# Patient Record
Sex: Female | Born: 1990
Health system: Southern US, Community
[De-identification: ages and names within clinical notes are randomized; demographics above are authoritative.]

## PROBLEM LIST (undated history)

## (undated) ENCOUNTER — Inpatient Hospital Stay (HOSPITAL_COMMUNITY): Payer: Self-pay

## (undated) ENCOUNTER — Ambulatory Visit (HOSPITAL_COMMUNITY): Admission: EM | Payer: Medicaid Other

## (undated) DIAGNOSIS — Z789 Other specified health status: Secondary | ICD-10-CM

## (undated) DIAGNOSIS — O210 Mild hyperemesis gravidarum: Secondary | ICD-10-CM

## (undated) HISTORY — PX: TONSILLECTOMY: SUR1361

---

## 2015-08-12 ENCOUNTER — Emergency Department (HOSPITAL_COMMUNITY)
Admission: EM | Admit: 2015-08-12 | Discharge: 2015-08-12 | Disposition: A | Discharge status: Home / Self Care | Payer: Medicaid Other

## 2015-08-12 NOTE — ED Notes (Signed)
Called pt to be triaged again, no response.

## 2015-08-12 NOTE — ED Notes (Signed)
Called pt to be triaged, no response.  

## 2015-11-18 ENCOUNTER — Encounter (HOSPITAL_COMMUNITY): Payer: Self-pay

## 2015-11-18 ENCOUNTER — Emergency Department (HOSPITAL_COMMUNITY)
Admission: EM | Admit: 2015-11-18 | Discharge: 2015-11-18 | Disposition: A | Discharge status: Home / Self Care | Payer: Medicaid Other | Attending: Emergency Medicine | Admitting: Emergency Medicine

## 2015-11-18 DIAGNOSIS — R112 Nausea with vomiting, unspecified: Secondary | ICD-10-CM | POA: Diagnosis not present

## 2015-11-18 DIAGNOSIS — R5383 Other fatigue: Secondary | ICD-10-CM | POA: Diagnosis not present

## 2015-11-18 DIAGNOSIS — F17200 Nicotine dependence, unspecified, uncomplicated: Secondary | ICD-10-CM | POA: Diagnosis not present

## 2015-11-18 DIAGNOSIS — Z5321 Procedure and treatment not carried out due to patient leaving prior to being seen by health care provider: Secondary | ICD-10-CM | POA: Diagnosis not present

## 2015-11-18 LAB — COMPREHENSIVE METABOLIC PANEL
ALT: 19 U/L (ref 14–54)
AST: 22 U/L (ref 15–41)
Albumin: 4.2 g/dL (ref 3.5–5.0)
Alkaline Phosphatase: 42 U/L (ref 38–126)
Anion gap: 4 — ABNORMAL LOW (ref 5–15)
BILIRUBIN TOTAL: 0.4 mg/dL (ref 0.3–1.2)
BUN: 11 mg/dL (ref 6–20)
CHLORIDE: 110 mmol/L (ref 101–111)
CO2: 26 mmol/L (ref 22–32)
CREATININE: 0.87 mg/dL (ref 0.44–1.00)
Calcium: 9.2 mg/dL (ref 8.9–10.3)
GFR calc Af Amer: >60 mL/min (ref >60)
Glucose, Bld: 84 mg/dL (ref 65–99)
Potassium: 4.2 mmol/L (ref 3.5–5.1)
Sodium: 140 mmol/L (ref 135–145)
Total Protein: 6.8 g/dL (ref 6.5–8.1)

## 2015-11-18 LAB — CBC
HCT: 40.2 % (ref 36.0–46.0)
Hemoglobin: 12.5 g/dL (ref 12.0–15.0)
MCH: 28.7 pg (ref 26.0–34.0)
MCHC: 31.1 g/dL (ref 30.0–36.0)
MCV: 92.2 fL (ref 78.0–100.0)
PLATELETS: 265 10*3/uL (ref 150–400)
RBC: 4.36 MIL/uL (ref 3.87–5.11)
RDW: 12.8 % (ref 11.5–15.5)
WBC: 8.8 10*3/uL (ref 4.0–10.5)

## 2015-11-18 LAB — LIPASE, BLOOD: LIPASE: 36 U/L (ref 11–51)

## 2015-11-18 NOTE — ED Triage Notes (Signed)
Pt is here she reports generalized body aches, fatigue, and n/v since Friday. Pt reports she feels unlike herself and has been waking in the middle of the night feeling sick.

## 2015-11-18 NOTE — ED Notes (Signed)
CALLED SEVERAL TIMES AND NO ANSWERS

## 2016-02-15 ENCOUNTER — Emergency Department (HOSPITAL_COMMUNITY)
Admission: EM | Admit: 2016-02-15 | Discharge: 2016-02-15 | Disposition: A | Discharge status: Home / Self Care | Payer: Medicaid Other | Attending: Emergency Medicine | Admitting: Emergency Medicine

## 2016-02-15 ENCOUNTER — Encounter (HOSPITAL_COMMUNITY): Payer: Self-pay

## 2016-02-15 DIAGNOSIS — F17200 Nicotine dependence, unspecified, uncomplicated: Secondary | ICD-10-CM | POA: Insufficient documentation

## 2016-02-15 DIAGNOSIS — J069 Acute upper respiratory infection, unspecified: Secondary | ICD-10-CM | POA: Insufficient documentation

## 2016-02-15 LAB — URINALYSIS, ROUTINE W REFLEX MICROSCOPIC
BILIRUBIN URINE: NEGATIVE
Glucose, UA: NEGATIVE mg/dL
HGB URINE DIPSTICK: NEGATIVE
Ketones, ur: NEGATIVE mg/dL
Leukocytes, UA: NEGATIVE
Nitrite: NEGATIVE
PH: 7 (ref 5.0–8.0)
Protein, ur: NEGATIVE mg/dL
SPECIFIC GRAVITY, URINE: 1.023 (ref 1.005–1.030)

## 2016-02-15 LAB — RAPID STREP SCREEN (MED CTR MEBANE ONLY): Streptococcus, Group A Screen (Direct): NEGATIVE

## 2016-02-15 LAB — PREGNANCY, URINE: Preg Test, Ur: NEGATIVE

## 2016-02-15 MED ORDER — AZITHROMYCIN 250 MG PO TABS: ORAL_TABLET | ORAL | 0 refills | Status: DC

## 2016-02-15 MED ORDER — CARBAMIDE PEROXIDE 6.5 % OT SOLN
5.0000 [drp] | Freq: Once | OTIC | Status: AC
  Administered 2016-02-15: 5 [drp] via OTIC
  Filled 2016-02-15: qty 15

## 2016-02-15 NOTE — Care Management Note (Signed)
Case Management Note  Patient Details  Name: Brittany Bernard MRN: [id number] Date of Birth: May 17, 1990  Subjective/Objective:                  25 y.o. female who presents with cc of URI sxs. Of note, the patient has coarse facial features including heavy brow, prominent frontal and jaw bones. She acknowledges that her feet have gotten larger and she has had a recent growth spurt and is now taller than her mother over the last year. She has been having headaches and worsening vision changes over time. 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   Action/Plan: Follow for disposition needs. Follow-up appointment   Expected Discharge Date:  02/15/16               Expected Discharge Plan:  Home/Self Care  In-House Referral:  NA  Discharge planning Services  CM Consult   Status of Service:  Completed, signed off  If discussed at Long Length of Stay Meetings, dates discussed:    Additional Comments: Ariyona Eid J. Lucretia RoersWood, RN, BSN, [place] [phone] [facility] appointment with Dr Hyman HopesJegede on 12/20 @2 :00.  Spoke with pt at bedside and advised to please arrive 15 min early and take a picture ID and your current medications.  Pt verbalizes understanding of keeping appointment.  Oletta CohnWood, Annasofia Pohl, RN 02/15/2016, 11:39 AM

## 2016-02-15 NOTE — ED Triage Notes (Signed)
Pt here reporting a week or URI symptoms, chills, sore throat, and ear pain. She reports fever at home. Tylenol for relief. Afebrile currently.

## 2016-02-15 NOTE — ED Provider Notes (Signed)
[facility]EM[facility]ENCY DEPT Provider Note   CSN: [id number] Arrival date & time: 02/15/16  [id number]     History   Chief Complaint Chief Complaint  Patient presents with  . Sore Throat  . URI    HPI Brittany Bernard is a 25 y.o. female who presents with cc of URI sxs. Onset 1.5 weeks. She has had cough, fever with T.MAX  101 F. She has a cough productive of clear sputum, sinus pressure, BL ear pain, Headache, myalgias and sore throat. Of note, the patient has coarse facial features including heavy brow, prominent frontal and jaw bones. She acknowledges that her feet have gotten larger and she has had a recent growth spurt and is now taller than her mother over the last year. She has been having headaches and worsening vision changes over time  HPI  History reviewed. No pertinent past medical history.  There are no active problems to display for this patient.   History reviewed. No pertinent surgical history.  OB History    No data available       Home Medications    Prior to Admission medications   Not on File    Family History History reviewed. No pertinent family history.  Social History Social History  Substance Use Topics  . Smoking status: Current Every Day Smoker    Packs/day: 0.50  . Smokeless tobacco: Never Used  . Alcohol use Yes     Comment: social      Allergies   Patient has no known allergies.   Review of Systems Review of Systems Ten systems reviewed and are negative for acute change, except as noted in the HPI.    Physical Exam Updated Vital Signs BP 104/66 (BP Location: Right Arm)   Temp 97.9 F (36.6 C) (Oral)   Resp 16   Ht 5\' 10"  (1.778 m)   Wt 90.7 kg   BMI 28.70 kg/m   Physical Exam  Constitutional: She is oriented to person, place, and time. She appears well-developed and well-nourished. No distress.  HENT:  Head: Normocephalic and atraumatic.  R TM normal,  left TM with cerumen impaction  Oropharynx with mild erythema, no  exudates  Coarse facial features with prominent jaw, brow and facial bones  Eyes: Conjunctivae and EOM are normal. Pupils are equal, round, and reactive to light. No scleral icterus.  Neck: Normal range of motion.  Cardiovascular: Normal rate, regular rhythm and normal heart sounds.  Exam reveals no gallop and no friction rub.   No murmur heard. Pulmonary/Chest: Effort normal and breath sounds normal. No respiratory distress. She has no wheezes.  Abdominal: Soft. Bowel sounds are normal. She exhibits no distension and no mass. There is no tenderness. There is no guarding.  Lymphadenopathy:    She has cervical adenopathy (tender tonsilar adenopathy).  Neurological: She is alert and oriented to person, place, and time.  Skin: Skin is warm and dry. Capillary refill takes less than 2 seconds. She is not diaphoretic.     ED Treatments / Results  Labs (all labs ordered are listed, but only abnormal results are displayed) Labs Reviewed  PREGNANCY, URINE    EKG  EKG Interpretation None       Radiology No results found.  Procedures Procedures (including critical care time)  Medications Ordered in ED Medications - No data to display   Initial Impression / Assessment and Plan / ED Course  I have reviewed the triage vital signs and the nursing notes.  Pertinent labs & imaging  results that were available during my care of the patient were reviewed by me and considered in my medical decision making (see chart for details).  Clinical Course     Patient with greater than 10 days of persistent sinus pain, fevers and URI symptoms. She'll be treated with azithromycin. Patient symptoms and features, also concerning for acromegaly. I have discussed the case with my attending physician, Dr. Rosalia Hammersay. Care management involved and [facility]. I have something in box message to Dr. Hyman HopesJegede. He will follow up with the patient on Thursday.  Final  Clinical Impressions(s) / ED Diagnoses   Final diagnoses:  Upper respiratory tract infection, unspecified type    New Prescriptions New Prescriptions   No medications on file     Arthor Captainbigail Raylyn Speckman, PA-C 02/15/16 1816    Margarita Grizzleanielle Ray, MD 02/22/16 1501

## 2016-02-15 NOTE — Discharge Instructions (Addendum)
[facility] [place] [phone]  You appear to have an upper respiratory infection (URI). An upper respiratory tract infection, or cold, is a viral infection of the air passages leading to the lungs. It is contagious and can be spread to others, especially during the first 3 or 4 days. It cannot be cured by antibiotics or other medicines. RETURN IMMEDIATELY IF you develop shortness of breath, confusion or altered mental status, a new rash, become dizzy, faint, or poorly responsive, or are unable to be cared for at home.

## 2016-02-17 LAB — CULTURE, GROUP A STREP (THRC)

## 2016-02-18 ENCOUNTER — Encounter: Payer: Self-pay | Admitting: Internal Medicine

## 2016-02-18 ENCOUNTER — Ambulatory Visit: Payer: Medicaid Other | Attending: Internal Medicine | Admitting: Internal Medicine

## 2016-02-18 VITALS — BP 98/53 | HR 102 | Temp 98.4°F | Resp 18 | Ht 70.0 in | Wt 195.4 lb

## 2016-02-18 DIAGNOSIS — Z5189 Encounter for other specified aftercare: Secondary | ICD-10-CM | POA: Insufficient documentation

## 2016-02-18 DIAGNOSIS — M898X9 Other specified disorders of bone, unspecified site: Secondary | ICD-10-CM

## 2016-02-18 DIAGNOSIS — E220 Acromegaly and pituitary gigantism: Secondary | ICD-10-CM

## 2016-02-18 DIAGNOSIS — Z87891 Personal history of nicotine dependence: Secondary | ICD-10-CM | POA: Diagnosis not present

## 2016-02-18 DIAGNOSIS — J029 Acute pharyngitis, unspecified: Secondary | ICD-10-CM

## 2016-02-18 HISTORY — DX: Other specified disorders of bone, unspecified site: M89.8X9

## 2016-02-18 LAB — COMPLETE METABOLIC PANEL WITH GFR
ALT: 12 U/L (ref 6–29)
AST: 17 U/L (ref 10–30)
Albumin: 4.1 g/dL (ref 3.6–5.1)
Alkaline Phosphatase: 45 U/L (ref 33–115)
BILIRUBIN TOTAL: 0.3 mg/dL (ref 0.2–1.2)
BUN: 11 mg/dL (ref 7–25)
CHLORIDE: 105 mmol/L (ref 98–110)
CO2: 24 mmol/L (ref 20–31)
Calcium: 9.3 mg/dL (ref 8.6–10.2)
Creat: 0.74 mg/dL (ref 0.50–1.10)
Glucose, Bld: 80 mg/dL (ref 65–99)
Potassium: 4.1 mmol/L (ref 3.5–5.3)
Sodium: 139 mmol/L (ref 135–146)
Total Protein: 7.2 g/dL (ref 6.1–8.1)

## 2016-02-18 LAB — POCT GLYCOSYLATED HEMOGLOBIN (HGB A1C): HEMOGLOBIN A1C: 5.4

## 2016-02-18 LAB — TSH: TSH: 0.41 m[IU]/L

## 2016-02-18 NOTE — Patient Instructions (Signed)
Acromegaly Acromegaly is a disease in which some parts of the body grow out of proportion to the rest of the body. It commonly affects the nose, ears, jaw, fingers, and toes. Acromegaly can lead to a number of conditions over time, including:  Diabetes.  Arthritis.  Heart disease.  Sleep apnea.  High blood pressure (hypertension). What are the causes? The most common cause of acromegaly is an overproduction of growth hormone by the pituitary gland. This can result from:  A noncancerous (benign) tumor on the pituitary gland. This is a common cause.  A tumor in another part of the body. This is a rare cause. Acromegaly can also be caused by a tumor that makes growth hormone itself. What are the signs or symptoms? Common signs and symptoms include:  Larger than normal hands, feet, head, face, brow, or jaw.  Swelling of the hands and feet.  Headaches.  Blurred vision.  Joint pain.  Numbness and tingling of the fingers.  Menstrual cycle changes in women.  Impotence in men.  Widely-spaced teeth. How is this diagnosed? To make a diagnosis, your health care provider will perform a physical exam and order tests. These will include blood tests and imaging tests, such as an MRI or a CT scan. How is this treated? Acromegaly may be treated with:  Surgery to remove the tumor that is causing the condition.  Medicines. Medicines may be given instead of surgery, or they may be given before surgery to shrink the tumor. They may also be given after surgery:  If the surgery is not successful.  If hormone levels do not return to normal.  Radiation therapy. This is done if surgery and medicines are not effective. Follow these instructions at home:  Keep all follow-up visits as directed by your health care provider. It is important to keep follow-up appointments all through your life so that your health care provider can:  Check whether your pituitary gland is working  normally.  Look for complications of the disease.  Get eye exams regularly.  Take medicines only as directed by your health care provider. Contact a health care provider if:  Your vision changes.  Your headaches get worse.  You have new symptoms or new pain or numbness.  You have pain or pressure in your chest.  Your symptoms do not get better with treatment. This information is not intended to replace advice given to you by your health care provider. Make sure you discuss any questions you have with your health care provider. Document Released: 10/21/2003 Document Revised: 06/24/2015 Document Reviewed: 09/23/2013 Elsevier Interactive Patient Education  2017 [employer]

## 2016-02-18 NOTE — Progress Notes (Signed)
Patient is here for a sore throat, she has pain scale of 8  Patient decline the flu shot today.

## 2016-02-18 NOTE — Progress Notes (Signed)
Brittany Bernard, is a 25 y.o. female  WUJ:[id number]:[id number]  NFA:[id number]  DOB - 1990/06/07  CC:  Chief Complaint  Patient presents with  . Sore Throat      HPI: Brittany Bernard is a 25 y.o. female here today to establish medical care. Patient was referred from the ED during a recent visit for URI. She was noticed to have coarse facial features and some physical features of gigantism. Appointment was made for full workup. She has no new complaint today but has noticed bone pains for years and muscle pains. She acknowledges that her feet have gotten larger and she has had a recent growth spurt and is now taller than her mother over the last year. She also has occasional headaches especially in the mornings and sometimes she has double/blurry vision. She has no significant family history of gigantism or acromegaly. No family history of any endocrine or autoimmune disorder, no history of brain tumor. Patient has No chest pain, No abdominal pain - No Nausea, No new weakness tingling or numbness, No Cough - SOB. She is currently on antibiotics for URI.   No Known Allergies History reviewed. No pertinent past medical history. Current Outpatient Prescriptions on File Prior to Visit  Medication Sig Dispense Refill  . azithromycin (ZITHROMAX Z-PAK) 250 MG tablet 2 po day one, then 1 daily x 4 days 5 tablet 0   No current facility-administered medications on file prior to visit.    History reviewed. No pertinent family history. Social History   Social History  . Marital status: Single    Spouse name: N/A  . Number of children: N/A  . Years of education: N/A   Occupational History  . Not on file.   Social History Main Topics  . Smoking status: Former Smoker    Packs/day: 0.50    Quit date: 02/14/2016  . Smokeless tobacco: Never Used  . Alcohol use Yes     Comment: social   . Drug use:     Types: Marijuana  . Sexual activity: Not on file   Other Topics Concern  . Not on file   Social  History Narrative  . No narrative on file    Review of Systems: Constitutional: Negative for fever, chills, diaphoresis, activity change, appetite change and fatigue. HENT: Negative for ear pain, nosebleeds, congestion, facial swelling, rhinorrhea, neck pain, neck stiffness and ear discharge.  Eyes: Negative for pain, discharge, redness, itching and visual disturbance. Respiratory: Negative for cough, choking, chest tightness, shortness of breath, wheezing and stridor.  Cardiovascular: Negative for chest pain, palpitations and leg swelling. Gastrointestinal: Negative for abdominal distention. Genitourinary: Negative for dysuria, urgency, frequency, hematuria, flank pain, decreased urine volume, difficulty urinating and dyspareunia.  Neurological: Negative for dizziness, tremors, seizures, syncope, facial asymmetry, speech difficulty, weakness, light-headedness, numbness and headaches.  Hematological: Negative for adenopathy. Does not bruise/bleed easily. Psychiatric/Behavioral: Negative for hallucinations, behavioral problems, confusion, dysphoric mood, decreased concentration and agitation.    Objective:   Vitals:   02/18/16 1427  BP: (!) 98/53  Pulse: (!) 102  Resp: 18  Temp: 98.4 F (36.9 C)    Physical Exam: Constitutional: Patient appears well-developed and well-nourished. No distress. Prominent jaw and frontal bones. HENT: Normocephalic, atraumatic, External right and left ear normal. Oropharynx is clear and moist.  Eyes: Conjunctivae and EOM are normal. PERRLA, no scleral icterus. Neck: Normal ROM. Neck supple. No JVD. No tracheal deviation. No thyromegaly. CVS: RRR, S1/S2 +, no murmurs, no gallops, no carotid bruit.  Pulmonary: Effort  and breath sounds normal, no stridor, rhonchi, wheezes, rales.  Abdominal: Soft. BS +, no distension, tenderness, rebound or guarding.  Musculoskeletal: Normal range of motion. No edema and no tenderness.  Lymphadenopathy: No lymphadenopathy  noted, cervical, inguinal or axillary Neuro: Alert. Normal reflexes, muscle tone coordination. No cranial nerve deficit. Skin: Skin is warm and dry. No rash noted. Not diaphoretic. No erythema. No pallor. Psychiatric: Normal mood and affect. Behavior, judgment, thought content normal.  Lab Results  Component Value Date   WBC 8.8 11/18/2015   HGB 12.5 11/18/2015   HCT 40.2 11/18/2015   MCV 92.2 11/18/2015   PLT 265 11/18/2015   Lab Results  Component Value Date   CREATININE 0.87 11/18/2015   BUN 11 11/18/2015   NA 140 11/18/2015   K 4.2 11/18/2015   CL 110 11/18/2015   CO2 26 11/18/2015    No results found for: HGBA1C Lipid Panel  No results found for: CHOL, TRIG, HDL, CHOLHDL, VLDL, LDLCALC      Assessment and plan:   1. Bone pain  - VITAMIN D 25 Hydroxy (Vit-D Deficiency, Fractures)  2. Sorethroat  - Start the prescribed medication - Follow up PRN is symptom persists  3. Gigantism (HCC)  - Insulin-like growth factor - POCT glycosylated hemoglobin (Hb A1C) - TSH - COMPLETE METABOLIC PANEL WITH GFR - Prolactin   Return in about 6 months (around 08/18/2016), or if symptoms worsen or fail to improve, for Routine Follow Up.  The patient was given clear instructions to go to ER or return to medical center if symptoms don't improve, worsen or new problems develop. The patient verbalized understanding. The patient was told to call to get lab results if they haven't heard anything in the next week.     This note has been created with Education officer, environmentalDragon speech recognition software and smart phrase technology. Any transcriptional errors are unintentional.    Jeanann LewandowskyJEGEDE, Jasmaine Rochel, MD, MHA, Maxwell CaulFACP, FAAP, CPE [facility] [place], [place] [phone]   02/18/2016, 3:00 PM

## 2016-02-19 LAB — VITAMIN D 25 HYDROXY (VIT D DEFICIENCY, FRACTURES): VIT D 25 HYDROXY: 13 ng/mL — AB (ref 30–100)

## 2016-02-19 LAB — PROLACTIN: Prolactin: 6.6 ng/mL

## 2016-02-24 LAB — INSULIN-LIKE GROWTH FACTOR
IGF-I, LC/MS: 223 ng/mL (ref 63–373)
Z-Score (Female): 0.5 SD (ref ?–2.0)

## 2016-02-26 ENCOUNTER — Other Ambulatory Visit: Payer: Self-pay | Admitting: Internal Medicine

## 2016-02-26 MED ORDER — VITAMIN D (ERGOCALCIFEROL) 1.25 MG (50000 UNIT) PO CAPS: 50000.0000 [IU] | ORAL_CAPSULE | ORAL | 1 refills | Status: DC

## 2016-03-01 ENCOUNTER — Telehealth: Payer: Self-pay

## 2016-03-01 NOTE — Telephone Encounter (Signed)
Patient Verify DOB  Patient was aware of her results being normal and other than her Vitamin D was low and was prescribed to pharmacy to pick up.  Also was inform that if she still haves blurry/double vision we was going to do an MRI of the brain. Other than that the patient did not have further questions.

## 2016-05-16 ENCOUNTER — Emergency Department (HOSPITAL_COMMUNITY)
Admission: EM | Admit: 2016-05-16 | Discharge: 2016-05-17 | Disposition: A | Discharge status: Home / Self Care | Payer: Medicaid Other | Attending: Emergency Medicine | Admitting: Emergency Medicine

## 2016-05-16 ENCOUNTER — Encounter (HOSPITAL_COMMUNITY): Payer: Self-pay | Admitting: Emergency Medicine

## 2016-05-16 DIAGNOSIS — Z5321 Procedure and treatment not carried out due to patient leaving prior to being seen by health care provider: Secondary | ICD-10-CM | POA: Diagnosis not present

## 2016-05-16 DIAGNOSIS — R1110 Vomiting, unspecified: Secondary | ICD-10-CM | POA: Diagnosis not present

## 2016-05-16 DIAGNOSIS — Z87891 Personal history of nicotine dependence: Secondary | ICD-10-CM | POA: Insufficient documentation

## 2016-05-16 LAB — CBC WITH DIFFERENTIAL/PLATELET
BASOS ABS: 0 10*3/uL (ref 0.0–0.1)
Basophils Relative: 0 %
Eosinophils Absolute: 0 10*3/uL (ref 0.0–0.7)
Eosinophils Relative: 0 %
HEMATOCRIT: 40.8 % (ref 36.0–46.0)
Hemoglobin: 13.7 g/dL (ref 12.0–15.0)
LYMPHS PCT: 6 %
Lymphs Abs: 0.7 10*3/uL (ref 0.7–4.0)
MCH: 29.7 pg (ref 26.0–34.0)
MCHC: 33.6 g/dL (ref 30.0–36.0)
MCV: 88.3 fL (ref 78.0–100.0)
Monocytes Absolute: 0.2 10*3/uL (ref 0.1–1.0)
Monocytes Relative: 2 %
NEUTROS ABS: 10.5 10*3/uL — AB (ref 1.7–7.7)
NEUTROS PCT: 92 %
PLATELETS: 266 10*3/uL (ref 150–400)
RBC: 4.62 MIL/uL (ref 3.87–5.11)
RDW: 13.2 % (ref 11.5–15.5)
WBC: 11.4 10*3/uL — AB (ref 4.0–10.5)

## 2016-05-16 LAB — BASIC METABOLIC PANEL
ANION GAP: 14 (ref 5–15)
BUN: 13 mg/dL (ref 6–20)
CO2: 20 mmol/L — AB (ref 22–32)
Calcium: 9.8 mg/dL (ref 8.9–10.3)
Chloride: 105 mmol/L (ref 101–111)
Creatinine, Ser: 0.83 mg/dL (ref 0.44–1.00)
GFR calc Af Amer: >60 mL/min (ref >60)
Glucose, Bld: 134 mg/dL — ABNORMAL HIGH (ref 65–99)
POTASSIUM: 3.4 mmol/L — AB (ref 3.5–5.1)
Sodium: 139 mmol/L (ref 135–145)

## 2016-05-16 LAB — I-STAT BETA HCG BLOOD, ED (MC, WL, AP ONLY): I-stat hCG, quantitative: <5 m[IU]/mL (ref <5)

## 2016-05-16 MED ORDER — ONDANSETRON 4 MG PO TBDP
ORAL_TABLET | ORAL | Status: AC
  Filled 2016-05-16: qty 1

## 2016-05-16 MED ORDER — ONDANSETRON 4 MG PO TBDP
4.0000 mg | ORAL_TABLET | Freq: Once | ORAL | Status: AC
  Administered 2016-05-16: 4 mg via ORAL

## 2016-05-16 NOTE — ED Triage Notes (Signed)
Patient reports emesis with diarrhea , chills and generalized body aches onset today .

## 2016-05-18 ENCOUNTER — Emergency Department (HOSPITAL_COMMUNITY)
Admission: EM | Admit: 2016-05-18 | Discharge: 2016-05-18 | Disposition: A | Discharge status: Home / Self Care | Payer: Medicaid Other | Attending: Emergency Medicine | Admitting: Emergency Medicine

## 2016-05-18 ENCOUNTER — Emergency Department (HOSPITAL_COMMUNITY): Payer: Medicaid Other

## 2016-05-18 ENCOUNTER — Encounter (HOSPITAL_COMMUNITY): Payer: Self-pay

## 2016-05-18 DIAGNOSIS — Z87891 Personal history of nicotine dependence: Secondary | ICD-10-CM | POA: Diagnosis not present

## 2016-05-18 DIAGNOSIS — Z79899 Other long term (current) drug therapy: Secondary | ICD-10-CM | POA: Insufficient documentation

## 2016-05-18 DIAGNOSIS — R112 Nausea with vomiting, unspecified: Secondary | ICD-10-CM | POA: Insufficient documentation

## 2016-05-18 DIAGNOSIS — R197 Diarrhea, unspecified: Secondary | ICD-10-CM | POA: Diagnosis present

## 2016-05-18 DIAGNOSIS — E876 Hypokalemia: Secondary | ICD-10-CM | POA: Insufficient documentation

## 2016-05-18 LAB — COMPREHENSIVE METABOLIC PANEL
ALT: 22 U/L (ref 14–54)
AST: 25 U/L (ref 15–41)
Albumin: 5 g/dL (ref 3.5–5.0)
Alkaline Phosphatase: 45 U/L (ref 38–126)
Anion gap: 11 (ref 5–15)
BILIRUBIN TOTAL: 0.9 mg/dL (ref 0.3–1.2)
BUN: 19 mg/dL (ref 6–20)
CALCIUM: 9.7 mg/dL (ref 8.9–10.3)
CO2: 22 mmol/L (ref 22–32)
Chloride: 103 mmol/L (ref 101–111)
Creatinine, Ser: 0.9 mg/dL (ref 0.44–1.00)
GFR calc Af Amer: >60 mL/min (ref >60)
GFR calc non Af Amer: >60 mL/min (ref >60)
Glucose, Bld: 119 mg/dL — ABNORMAL HIGH (ref 65–99)
Potassium: 2.9 mmol/L — ABNORMAL LOW (ref 3.5–5.1)
Sodium: 136 mmol/L (ref 135–145)
TOTAL PROTEIN: 8.6 g/dL — AB (ref 6.5–8.1)

## 2016-05-18 LAB — URINALYSIS, ROUTINE W REFLEX MICROSCOPIC
BILIRUBIN URINE: NEGATIVE
Glucose, UA: NEGATIVE mg/dL
Ketones, ur: 80 mg/dL — AB
Leukocytes, UA: NEGATIVE
NITRITE: NEGATIVE
Protein, ur: 30 mg/dL — AB
SPECIFIC GRAVITY, URINE: 1.034 — AB (ref 1.005–1.030)
pH: 5 (ref 5.0–8.0)

## 2016-05-18 LAB — CBC
HCT: 42.5 % (ref 36.0–46.0)
HEMOGLOBIN: 14.5 g/dL (ref 12.0–15.0)
MCH: 29.5 pg (ref 26.0–34.0)
MCHC: 34.1 g/dL (ref 30.0–36.0)
MCV: 86.6 fL (ref 78.0–100.0)
Platelets: 321 10*3/uL (ref 150–400)
RBC: 4.91 MIL/uL (ref 3.87–5.11)
RDW: 13.3 % (ref 11.5–15.5)
WBC: 15.1 10*3/uL — ABNORMAL HIGH (ref 4.0–10.5)

## 2016-05-18 LAB — LIPASE, BLOOD: Lipase: 18 U/L (ref 11–51)

## 2016-05-18 LAB — I-STAT BETA HCG BLOOD, ED (MC, WL, AP ONLY): I-stat hCG, quantitative: <5 m[IU]/mL (ref <5)

## 2016-05-18 MED ORDER — ONDANSETRON HCL 4 MG/2ML IJ SOLN
4.0000 mg | Freq: Once | INTRAMUSCULAR | Status: AC
  Administered 2016-05-18: 4 mg via INTRAVENOUS
  Filled 2016-05-18: qty 2

## 2016-05-18 MED ORDER — CAPSAICIN 0.025 % EX CREA
TOPICAL_CREAM | Freq: Once | CUTANEOUS | Status: AC
  Administered 2016-05-18: 13:00:00 via TOPICAL
  Filled 2016-05-18: qty 60

## 2016-05-18 MED ORDER — POTASSIUM CHLORIDE ER 20 MEQ PO TBCR: 20.0000 meq | EXTENDED_RELEASE_TABLET | Freq: Two times a day (BID) | ORAL | 0 refills | Status: DC

## 2016-05-18 MED ORDER — HALOPERIDOL LACTATE 5 MG/ML IJ SOLN
2.0000 mg | Freq: Once | INTRAMUSCULAR | Status: AC
  Administered 2016-05-18: 2 mg via INTRAVENOUS
  Filled 2016-05-18: qty 1

## 2016-05-18 MED ORDER — PROMETHAZINE HCL 25 MG PO TABS: 25.0000 mg | ORAL_TABLET | Freq: Four times a day (QID) | ORAL | 0 refills | Status: DC | PRN

## 2016-05-18 MED ORDER — SODIUM CHLORIDE 0.9 % IV BOLUS (SEPSIS)
1000.0000 mL | Freq: Once | INTRAVENOUS | Status: AC
  Administered 2016-05-18: 1000 mL via INTRAVENOUS

## 2016-05-18 MED ORDER — ONDANSETRON 8 MG PO TBDP: 8.0000 mg | ORAL_TABLET | Freq: Three times a day (TID) | ORAL | 0 refills | Status: DC | PRN

## 2016-05-18 NOTE — ED Triage Notes (Addendum)
Pt c/o generalized abdominal pain and n/v/d x 5 days.  Pain score 8/10.  Pt reports taking OTC medication w/o relief.  Pt was recently on a cruise.  Pt noticed some vaginal spotting this morning.  Sts "I havent seen that in years."  Pt has an IUD.  Pt [facility] x 2 days ago.

## 2016-05-18 NOTE — ED Notes (Signed)
This RN called into pts room after application of zostrix cream. Pt reports burning sensation and requesting pain medication through IV.

## 2016-05-18 NOTE — Discharge Instructions (Signed)
Zofran for nausea and vomiting. Phenergan if no relief with zofran. Avoid marijuana use. Follow up with family doctor if not improving. Follow up for potassium recheck. Take potassium as prescribed, it was low today. Return if unable to keep liquids down, if develop fever, worsening pain, any new concerning findings.

## 2016-05-18 NOTE — ED Provider Notes (Signed)
[facility]-EMERGENCY DEPT Provider Note   CSN: [id number] Arrival date & time: 05/18/16  [id number]     History   Chief Complaint Chief Complaint  Patient presents with  . Abdominal Pain  . Emesis  . Diarrhea    HPI Brittany Bernard is a 26 y.o. female.  HPI Brittany Bernard is a 26 y.o. female presents to emergency department complaining of nausea, vomiting, abdominal pain for 3 days. She states she is unable to keep anything down. Reports some diarrhea which now resolved. She states she feels dizzy and dehydrated. She has not tried any medications to help her with her symptoms. She denies being pregnant. Denies any fever or chills. No URI symptoms. She did eat at cookout before symptoms began. No other sick contacts.   History reviewed. No pertinent past medical history.  Patient Active Problem List   Diagnosis Date Noted  . Bone pain 02/18/2016  . 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 TONSILLECTOMY      OB History    No data available       Home Medications    Prior to Admission medications   Medication Sig Start Date End Date Taking? Authorizing Provider  OVER THE COUNTER MEDICATION Take 1 tablet by mouth daily as needed (nausea). OTC nausea medication-- unsure of name   Yes Historical Provider, MD  Vitamin D, Ergocalciferol, (DRISDOL) 50000 units CAPS capsule Take 1 capsule (50,000 Units total) by mouth every 7 (seven) days. Patient not taking: Reported on 05/18/2016 02/26/16   Quentin Angstlugbemiga E Jegede, MD    Family History History reviewed. No pertinent family history.  Social History Social History  Substance Use Topics  . Smoking status: Former Smoker    Packs/day: 0.50    Quit date: 02/14/2016  . Smokeless tobacco: Never Used  . Alcohol use Yes     Comment: social      Allergies   Patient has no known allergies.   Review of Systems Review of Systems  Constitutional: Negative for chills  and fever.  Respiratory: Negative for cough, chest tightness and shortness of breath.   Cardiovascular: Negative for chest pain, palpitations and leg swelling.  Gastrointestinal: Positive for abdominal pain, diarrhea, nausea and vomiting.  Genitourinary: Negative for dysuria, flank pain, pelvic pain, vaginal bleeding, vaginal discharge and vaginal pain.  Musculoskeletal: Negative for arthralgias, myalgias, neck pain and neck stiffness.  Skin: Negative for rash.  Neurological: Negative for dizziness, weakness and headaches.  All other systems reviewed and are negative.    Physical Exam Updated Vital Signs BP 112/72 (BP Location: Left Arm)   Pulse 64   Temp 97.5 F (36.4 C) (Oral)   Resp 18   Ht 5\' 10"  (1.778 m)   Wt 90.7 kg   SpO2 100%   BMI 28.70 kg/m   Physical Exam  Constitutional: She is oriented to person, place, and time. She appears well-developed and well-nourished.  Actively vomiting  HENT:  Head: Normocephalic.  Eyes: Conjunctivae are normal.  Neck: Neck supple.  Cardiovascular: Normal rate, regular rhythm and normal heart sounds.   Pulmonary/Chest: Effort normal and breath sounds normal. No respiratory distress. She has no wheezes. She has no rales.  Abdominal: Soft. Bowel sounds are normal. She exhibits no distension. There is tenderness. There is no rebound.  RUQ tenderness  Musculoskeletal: She exhibits no edema.  Neurological: She is alert and oriented to person, place, and time.  Skin: Skin is warm  and dry.  Psychiatric: She has a normal mood and affect. Her behavior is normal.  Nursing note and vitals reviewed.    ED Treatments / Results  Labs (all labs ordered are listed, but only abnormal results are displayed) Labs Reviewed  COMPREHENSIVE METABOLIC PANEL - Abnormal; Notable for the following:       Result Value   Potassium 2.9 (*)    Glucose, Bld 119 (*)    Total Protein 8.6 (*)    All other components within normal limits  CBC - Abnormal;  Notable for the following:    WBC 15.1 (*)    All other components within normal limits  URINALYSIS, ROUTINE W REFLEX MICROSCOPIC - Abnormal; Notable for the following:    APPearance HAZY (*)    Specific Gravity, Urine 1.034 (*)    Hgb urine dipstick SMALL (*)    Ketones, ur 80 (*)    Protein, ur 30 (*)    All other components within normal limits  LIPASE, BLOOD  I-STAT BETA HCG BLOOD, ED ([facility], [facility], [facility] ONLY)    EKG  EKG Interpretation None       Radiology US Abdomen Complete  Result Date: 05/18/2016 CLINICAL DATA:  Abdominal pain with vomiting for 4 days EXAM: ABDOMEN ULTRASOUND COMPLETE COMPARISON:  None. FINDINGS: Gallbladder: No gallstones or wall thickening visualized. There is no pericholecystic fluid. No sonographic Murphy sign noted by sonographer. Common bile duct: Diameter: 2 mm. There is no intrahepatic, common hepatic, or common bile duct dilatation. Liver: No focal lesion identified. Within normal limits in parenchymal echogenicity. IVC: No abnormality visualized. Pancreas: No mass or inflammatory focus. Spleen: Size and appearance within normal limits. Right Kidney: Length: 12.1 cm. Echogenicity within normal limits. No mass or hydronephrosis visualized. Left Kidney: Length: 10.1 cm. Echogenicity within normal limits. No mass or hydronephrosis visualized. Abdominal aorta: No aneurysm visualized. Other findings: No demonstrable ascites. IMPRESSION: Borderline size discrepancy between kidneys. Significance of this finding uncertain. If patient is hypertensive, this finding potentially could indicate a degree of renal artery stenosis on the right. Study otherwise unremarkable. Electronically Signed   By: Bretta Bang III M.D.   On: 05/18/2016 11:36    Procedures Procedures (including critical care time)  Medications Ordered in ED Medications  ondansetron (ZOFRAN) injection 4 mg (4 mg Intravenous Given 05/18/16 0947)  sodium chloride 0.9 % bolus 1,000 mL (0 mLs Intravenous  Stopped 05/18/16 1237)  capsaicin (ZOSTRIX) 0.025 % cream ( Topical Given 05/18/16 1237)  haloperidol lactate (HALDOL) injection 2 mg (2 mg Intravenous Given 05/18/16 1315)     Initial Impression / Assessment and Plan / ED Course  I have reviewed the triage vital signs and the nursing notes.  Pertinent labs & imaging results that were available during my care of the patient were reviewed by me and considered in my medical decision making (see chart for details).     Pt in ED with nausea, vomiting, weakness onset 3 days ago. RUQ pain on exam. Will check labs, get [place] abd. UA ordered as well. Pt is a daily marijuana smoker.    1:30 PM [place] negative other than finding mentioned above. Pt has no hx of HTN. Pt's labs showing WBC of 15.1. Potassium 2.9. Had no relief with zofran. Tried caspacian cream which she did not tolerate due to burning and washed it off. Will give haldol. Pt is not pregnant.   2:49 PM Pt feeling better. Drank sprite. No vomiting. Will dc home with phenergan and close outpatient  follow up. Discussed marijuana cessation. Will also prescribe potassium given K of 2.9.   Vitals:   05/18/16 0828 05/18/16 0836 05/18/16 1244 05/18/16 1317  BP: 112/72  117/82 112/63  Pulse: 64  (!) 54 (!) 53  Resp: 18  16 15   Temp: 97.5 F (36.4 C)     TempSrc: Oral     SpO2: 100%  98% 99%  Weight:  90.7 kg    Height:  5\' 10"  (1.778 m)        Final Clinical Impressions(s) / ED Diagnoses   Final diagnoses:  Nausea and vomiting, intractability of vomiting not specified, unspecified vomiting type  Hypokalemia    New Prescriptions New Prescriptions   ONDANSETRON (ZOFRAN ODT) 8 MG DISINTEGRATING TABLET    Take 1 tablet (8 mg total) by mouth every 8 (eight) hours as needed for nausea or vomiting.   POTASSIUM CHLORIDE 20 MEQ TBCR    Take 20 mEq by mouth 2 (two) times daily.   PROMETHAZINE (PHENERGAN) 25 MG TABLET    Take 1 tablet (25 mg total) by mouth every 6 (six) hours as needed for  nausea or vomiting.     Jaynie Crumble, PA-C 05/18/16 1534    Gerhard Munch, MD 05/18/16 [phone]

## 2016-05-22 ENCOUNTER — Emergency Department (HOSPITAL_COMMUNITY)
Admission: EM | Admit: 2016-05-22 | Discharge: 2016-05-22 | Disposition: A | Discharge status: Home / Self Care | Payer: Medicaid Other | Attending: Emergency Medicine | Admitting: Emergency Medicine

## 2016-05-22 ENCOUNTER — Encounter (HOSPITAL_COMMUNITY): Payer: Self-pay | Admitting: Emergency Medicine

## 2016-05-22 DIAGNOSIS — Z87891 Personal history of nicotine dependence: Secondary | ICD-10-CM | POA: Diagnosis not present

## 2016-05-22 DIAGNOSIS — E860 Dehydration: Secondary | ICD-10-CM | POA: Insufficient documentation

## 2016-05-22 DIAGNOSIS — E876 Hypokalemia: Secondary | ICD-10-CM | POA: Diagnosis not present

## 2016-05-22 DIAGNOSIS — R112 Nausea with vomiting, unspecified: Secondary | ICD-10-CM | POA: Diagnosis present

## 2016-05-22 DIAGNOSIS — R197 Diarrhea, unspecified: Secondary | ICD-10-CM

## 2016-05-22 LAB — COMPREHENSIVE METABOLIC PANEL
ALT: 15 U/L (ref 14–54)
ANION GAP: 10 (ref 5–15)
AST: 16 U/L (ref 15–41)
Albumin: 4.6 g/dL (ref 3.5–5.0)
Alkaline Phosphatase: 40 U/L (ref 38–126)
BUN: 15 mg/dL (ref 6–20)
CHLORIDE: 102 mmol/L (ref 101–111)
CO2: 22 mmol/L (ref 22–32)
CREATININE: 0.82 mg/dL (ref 0.44–1.00)
Calcium: 9.2 mg/dL (ref 8.9–10.3)
Glucose, Bld: 112 mg/dL — ABNORMAL HIGH (ref 65–99)
POTASSIUM: 2.8 mmol/L — AB (ref 3.5–5.1)
Sodium: 134 mmol/L — ABNORMAL LOW (ref 135–145)
Total Bilirubin: 1.2 mg/dL (ref 0.3–1.2)
Total Protein: 7.9 g/dL (ref 6.5–8.1)

## 2016-05-22 LAB — CBC WITH DIFFERENTIAL/PLATELET
Basophils Absolute: 0 10*3/uL (ref 0.0–0.1)
Basophils Relative: 0 %
EOS ABS: 0 10*3/uL (ref 0.0–0.7)
EOS PCT: 0 %
HCT: 41.3 % (ref 36.0–46.0)
Hemoglobin: 14.3 g/dL (ref 12.0–15.0)
LYMPHS ABS: 2.7 10*3/uL (ref 0.7–4.0)
LYMPHS PCT: 22 %
MCH: 29.3 pg (ref 26.0–34.0)
MCHC: 34.6 g/dL (ref 30.0–36.0)
MCV: 84.6 fL (ref 78.0–100.0)
MONO ABS: 0.5 10*3/uL (ref 0.1–1.0)
MONOS PCT: 4 %
Neutro Abs: 9.1 10*3/uL — ABNORMAL HIGH (ref 1.7–7.7)
Neutrophils Relative %: 74 %
PLATELETS: 314 10*3/uL (ref 150–400)
RBC: 4.88 MIL/uL (ref 3.87–5.11)
RDW: 12.8 % (ref 11.5–15.5)
WBC: 12.3 10*3/uL — ABNORMAL HIGH (ref 4.0–10.5)

## 2016-05-22 LAB — MAGNESIUM: Magnesium: 2 mg/dL (ref 1.7–2.4)

## 2016-05-22 LAB — LIPASE, BLOOD: LIPASE: 24 U/L (ref 11–51)

## 2016-05-22 MED ORDER — LACTATED RINGERS IV BOLUS (SEPSIS)
1000.0000 mL | Freq: Once | INTRAVENOUS | Status: AC
  Administered 2016-05-22: 1000 mL via INTRAVENOUS

## 2016-05-22 MED ORDER — PROMETHAZINE HCL 25 MG PO TABS: 25.0000 mg | ORAL_TABLET | ORAL | Status: DC | PRN

## 2016-05-22 MED ORDER — PROMETHAZINE HCL 25 MG/ML IJ SOLN
25.0000 mg | Freq: Once | INTRAMUSCULAR | Status: AC
  Administered 2016-05-22: 25 mg via INTRAVENOUS
  Filled 2016-05-22: qty 1

## 2016-05-22 MED ORDER — POTASSIUM CHLORIDE CRYS ER 20 MEQ PO TBCR
60.0000 meq | EXTENDED_RELEASE_TABLET | Freq: Once | ORAL | Status: AC
  Administered 2016-05-22: 60 meq via ORAL
  Filled 2016-05-22: qty 3

## 2016-05-22 MED ORDER — SUCRALFATE 1 G PO TABS
1.0000 g | ORAL_TABLET | Freq: Once | ORAL | Status: AC
  Administered 2016-05-22: 1 g via ORAL
  Filled 2016-05-22: qty 1

## 2016-05-22 MED ORDER — ONDANSETRON 8 MG PO TBDP: 8.0000 mg | ORAL_TABLET | Freq: Three times a day (TID) | ORAL | 0 refills | Status: DC | PRN

## 2016-05-22 MED ORDER — PROMETHAZINE HCL 25 MG PO TABS: 25.0000 mg | ORAL_TABLET | Freq: Four times a day (QID) | ORAL | 0 refills | Status: DC | PRN

## 2016-05-22 NOTE — ED Notes (Signed)
Gave pt sprite in addition to water given earlier

## 2016-05-22 NOTE — ED Provider Notes (Signed)
[facility]-EMERGENCY DEPT Provider Note   CSN: [id number] Arrival date & time: 05/22/16  6213     History   Chief Complaint Chief Complaint  Patient presents with  . Emesis  . Abdominal Pain    HPI Brittany Bernard is a 26 y.o. female.  HPI  Pt comes in with cc of abd pain, nausea, emesis, diarrhea. Pt reports that she has been having these symptoms for 1 week now. Pt reports that she had emesis x 12 times y'day, they were bilious. Pt also had 1 loose BM. She initially had diarrhea, but that is getting better Brittany Bernard. Pt has generalized abd pain, worse with palpation.   Patient was seen in the ER earlier in the week. Her labs were reassuring except for e'lytes, [place] abd didn't show any acute findings. Pt was discharged with promethazine - and she reports that the medicine helps her, but it makes her drowsy and affects her job.  Pt has no hx of abd surgery. Pt denies heavy alcohol use. She does indicated marijuana use every other day - but since she got sick, she hasnt used marijuana.  History reviewed. No pertinent past medical history.  Patient Active Problem List   Diagnosis Date Noted  . Bone pain 02/18/2016  . 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 TONSILLECTOMY      OB History    No data available       Home Medications    Prior to Admission medications   Medication Sig Start Date End Date Taking? Authorizing Provider  ondansetron (ZOFRAN ODT) 8 MG disintegrating tablet Take 1 tablet (8 mg total) by mouth every 8 (eight) hours as needed for nausea or vomiting. 05/18/16   Tatyana Kirichenko, PA-C  OVER THE COUNTER MEDICATION Take 1 tablet by mouth daily as needed (nausea). OTC nausea medication-- unsure of name    Historical Provider, MD  potassium chloride 20 MEQ TBCR Take 20 mEq by mouth 2 (two) times daily. 05/18/16   Tatyana Kirichenko, PA-C  promethazine (PHENERGAN) 25 MG tablet Take 1 tablet  (25 mg total) by mouth every 6 (six) hours as needed for nausea or vomiting. 05/18/16   Jaynie Crumble, PA-C  Vitamin D, Ergocalciferol, (DRISDOL) 50000 units CAPS capsule Take 1 capsule (50,000 Units total) by mouth every 7 (seven) days. Patient not taking: Reported on 05/18/2016 02/26/16   Quentin Angst, MD    Family History History reviewed. No pertinent family history.  Social History Social History  Substance Use Topics  . Smoking status: Former Smoker    Packs/day: 0.50    Quit date: 02/14/2016  . Smokeless tobacco: Never Used  . Alcohol use Yes     Comment: social      Allergies   Patient has no known allergies.   Review of Systems Review of Systems  Constitutional: Positive for activity change and appetite change.  Gastrointestinal: Positive for abdominal pain, nausea and vomiting.  Neurological: Positive for light-headedness.    ROS 10 Systems reviewed and are negative for acute change except as noted in the HPI.    Physical Exam Updated Vital Signs BP 137/88   Pulse 73   Temp 97.9 F (36.6 C) (Oral)   Resp 16   SpO2 98%   Physical Exam  Constitutional: She is oriented to person, place, and time. She appears well-developed and well-nourished.  HENT:  Head: Normocephalic and atraumatic.  Mucus membrane appear dry  Eyes:  EOM are normal. Pupils are equal, round, and reactive to light.  Neck: Neck supple.  Cardiovascular: Normal rate, regular rhythm and normal heart sounds.   No murmur heard. Pulmonary/Chest: Effort normal. No respiratory distress.  Abdominal: Soft. She exhibits no distension. There is tenderness. There is no rebound and no guarding.  Generalized tenderness  Neurological: She is alert and oriented to person, place, and time.  Skin: Skin is warm and dry.  Nursing note and vitals reviewed.     ED Treatments / Results  Labs (all labs ordered are listed, but only abnormal results are displayed) Labs Reviewed  CBC WITH  DIFFERENTIAL/PLATELET - Abnormal; Notable for the following:       Result Value   WBC 12.3 (*)    Neutro Abs 9.1 (*)    All other components within normal limits  COMPREHENSIVE METABOLIC PANEL  MAGNESIUM  LIPASE, BLOOD    EKG  EKG Interpretation None       Radiology No results found.  Procedures Procedures (including critical care time)  Medications Ordered in ED Medications  lactated ringers bolus 1,000 mL (1,000 mLs Intravenous New Bag/Given 05/22/16 1012)  sucralfate (CARAFATE) tablet 1 g (not administered)  promethazine (PHENERGAN) injection 25 mg (25 mg Intravenous Given 05/22/16 1012)     Initial Impression / Assessment and Plan / ED Course  I have reviewed the triage vital signs and the nursing notes.  Pertinent labs & imaging results that were available during my care of the patient were reviewed by me and considered in my medical decision making (see chart for details).     Pt comes in with cc of abd pain. Abd pain - generalized, not peritoneal. Pt's US is normal from last visit. N/V - we will check e;lytes, and get symptom control only. Diarrhea- it really isn't diarrhea. She is having 1 loose BM/day.  PT had a trip to [place] in Feb- but no one from that cruise is sick, and her symptoms started 2 weeks after the cruise.  Pt has no uti like sx. And she denies vaginal d.c.  No indication for imaging.  It appears that pt is having cyclic vomiting syndrome. I am not certain that it is cannbis related, although we did discuss with her the possibility of that does exist. At this time, I think it will be best for patient to see GI as her symptoms havent improved at all and have been present for > 1 week.   Final Clinical Impressions(s) / ED Diagnoses   Final diagnoses:  Nausea vomiting and diarrhea  Dehydration    New Prescriptions New Prescriptions   No medications on file     Derwood KaplanAnkit Eduardo Honor, MD 05/22/16 1018

## 2016-05-22 NOTE — ED Triage Notes (Signed)
Pt reports generalized abd pain, emesis and diarrhea for the past week. Was seen here for same a few days ago. Pt reports she has been unable to keep down fluids.

## 2016-05-22 NOTE — Discharge Instructions (Signed)
We saw you in the ER for nausea, vomiting, diarrhea. All the results in the ER are normal, labs and imaging - except for slightly low potassium. We are not sure what is causing your symptoms. The workup in the ER is not complete, and is limited to screening for life threatening and emergent conditions only, so please see a primary care doctor for further evaluation.  Please return to the ER if your symptoms worsen; you have increased pain, fevers, chills, inability to keep any medications down, confusion. Otherwise see the outpatient doctor as requested.

## 2016-05-22 NOTE — ED Notes (Signed)
Bed: [id number] Expected date:  Expected time:  Means of arrival:  Comments: 26 yo N/V, abd pain

## 2017-01-15 ENCOUNTER — Encounter (HOSPITAL_COMMUNITY): Payer: Self-pay

## 2017-01-15 ENCOUNTER — Emergency Department (HOSPITAL_COMMUNITY)
Admission: EM | Admit: 2017-01-15 | Discharge: 2017-01-15 | Disposition: A | Discharge status: Home / Self Care | Payer: Self-pay | Attending: Emergency Medicine | Admitting: Emergency Medicine

## 2017-01-15 ENCOUNTER — Emergency Department (HOSPITAL_COMMUNITY): Payer: Self-pay

## 2017-01-15 DIAGNOSIS — R109 Unspecified abdominal pain: Secondary | ICD-10-CM

## 2017-01-15 DIAGNOSIS — R112 Nausea with vomiting, unspecified: Secondary | ICD-10-CM | POA: Insufficient documentation

## 2017-01-15 DIAGNOSIS — R1030 Lower abdominal pain, unspecified: Secondary | ICD-10-CM | POA: Insufficient documentation

## 2017-01-15 DIAGNOSIS — Z87891 Personal history of nicotine dependence: Secondary | ICD-10-CM | POA: Insufficient documentation

## 2017-01-15 DIAGNOSIS — O26899 Other specified pregnancy related conditions, unspecified trimester: Secondary | ICD-10-CM

## 2017-01-15 LAB — URINALYSIS, ROUTINE W REFLEX MICROSCOPIC
BILIRUBIN URINE: NEGATIVE
GLUCOSE, UA: NEGATIVE mg/dL
HGB URINE DIPSTICK: NEGATIVE
KETONES UR: 5 mg/dL — AB
LEUKOCYTES UA: NEGATIVE
Nitrite: NEGATIVE
PROTEIN: NEGATIVE mg/dL
Specific Gravity, Urine: 1.028 (ref 1.005–1.030)
pH: 5 (ref 5.0–8.0)

## 2017-01-15 LAB — COMPREHENSIVE METABOLIC PANEL
ALT: 14 U/L (ref 14–54)
AST: 19 U/L (ref 15–41)
Albumin: 4.5 g/dL (ref 3.5–5.0)
Alkaline Phosphatase: 40 U/L (ref 38–126)
Anion gap: 9 (ref 5–15)
BUN: 10 mg/dL (ref 6–20)
CHLORIDE: 104 mmol/L (ref 101–111)
CO2: 21 mmol/L — ABNORMAL LOW (ref 22–32)
Calcium: 9.3 mg/dL (ref 8.9–10.3)
Creatinine, Ser: 0.67 mg/dL (ref 0.44–1.00)
GFR calc Af Amer: >60 mL/min (ref >60)
GLUCOSE: 104 mg/dL — AB (ref 65–99)
Potassium: 3.5 mmol/L (ref 3.5–5.1)
Sodium: 134 mmol/L — ABNORMAL LOW (ref 135–145)
Total Bilirubin: 0.7 mg/dL (ref 0.3–1.2)
Total Protein: 7.3 g/dL (ref 6.5–8.1)

## 2017-01-15 LAB — I-STAT BETA HCG BLOOD, ED (MC, WL, AP ONLY): I-stat hCG, quantitative: >2000 m[IU]/mL — ABNORMAL HIGH (ref <5)

## 2017-01-15 LAB — CBC
HCT: 38.3 % (ref 36.0–46.0)
Hemoglobin: 12.8 g/dL (ref 12.0–15.0)
MCH: 29.6 pg (ref 26.0–34.0)
MCHC: 33.4 g/dL (ref 30.0–36.0)
MCV: 88.5 fL (ref 78.0–100.0)
PLATELETS: 252 10*3/uL (ref 150–400)
RBC: 4.33 MIL/uL (ref 3.87–5.11)
RDW: 12.6 % (ref 11.5–15.5)
WBC: 9.6 10*3/uL (ref 4.0–10.5)

## 2017-01-15 LAB — LIPASE, BLOOD: LIPASE: 21 U/L (ref 11–51)

## 2017-01-15 MED ORDER — METOCLOPRAMIDE HCL 5 MG/ML IJ SOLN
10.0000 mg | Freq: Once | INTRAMUSCULAR | Status: AC
  Administered 2017-01-15: 10 mg via INTRAVENOUS
  Filled 2017-01-15: qty 2

## 2017-01-15 MED ORDER — METOCLOPRAMIDE HCL 10 MG PO TABS: 10.0000 mg | ORAL_TABLET | Freq: Four times a day (QID) | ORAL | 0 refills | Status: DC

## 2017-01-15 MED ORDER — SODIUM CHLORIDE 0.9 % IV BOLUS (SEPSIS)
1000.0000 mL | Freq: Once | INTRAVENOUS | Status: AC
  Administered 2017-01-15: 1000 mL via INTRAVENOUS

## 2017-01-15 MED ORDER — METOCLOPRAMIDE HCL 5 MG/ML IJ SOLN: 10.0000 mg | Freq: Once | INTRAMUSCULAR | Status: DC

## 2017-01-15 NOTE — Discharge Instructions (Signed)
See your OBGYN for follow-up.  Your testing today did not clarify whether you have a normal pregnancy or whether this is a tubal pregnancy however you have decided to leave it for personal reasons, if you should develop increasing pain, vaginal bleeding, increasing vomiting, fevers or any other worsening or concerning symptoms he should go to [facility] or the nearest emergency department immediately.  Please follow-up within 48 hours for recheck.  Metoclopramide 10 mg by mouth every 6 hours as needed.

## 2017-01-15 NOTE — ED Provider Notes (Signed)
[facility] Provider Note   CSN: [id number] Arrival date & time: 01/15/17  1201     History   Chief Complaint Chief Complaint  Patient presents with  . Emesis    HPI Brittany Bernard is a 26 y.o. female.  HPI  The pt is a 26 y/o female -she is presenting today with a complaint of nausea vomiting and lower abdominal pain also associated with some watery diarrhea.  Symptoms started approximately 36 hours ago but is been persistent, nothing seems to make this better, it gets worse when she tries to eat or drink that she has not had much in the last 24 hours.  Her abdominal discomfort is very crampy and is located in the lower abdomen, bilateral pelvic regions in the suprapubic region.  She does note some increased urinary frequency but this is a chronic problem.  She had her intrauterine device removed approximately 3 months ago and is concerned that she may be pregnant since she is having the cramping and the nausea.  She denies vaginal bleeding, denies fevers chills, denies blood in the stool, denies back pain swelling of the legs or rashes.  She denies taking any daily medications, she has not tried any of the nausea medicine she has been prescribed recently for what she reports as a possible cannabis hyperemesis syndrome.  History reviewed. No pertinent past medical history.  Patient Active Problem List   Diagnosis Date Noted  . Bone pain 02/18/2016  . 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 TONSILLECTOMY      OB History    No data available       Home Medications    Prior to Admission medications   Medication Sig Start Date End Date Taking? Authorizing Provider  metoCLOPramide (REGLAN) 10 MG tablet Take 1 tablet (10 mg total) every 6 (six) hours by mouth. 01/15/17   Eber HongMiller, Coleen Cardiff, MD    Family History History reviewed. No pertinent family history.  Social  History Social History   Tobacco Use  . Smoking status: Former Smoker    Packs/day: 0.50    Last attempt to quit: 02/14/2016    Years since quitting: 0.9  . Smokeless tobacco: Never Used  Substance Use Topics  . Alcohol use: Yes    Comment: social   . Drug use: Yes    Types: Marijuana    Comment: Daily     Allergies   Patient has no known allergies.   Review of Systems Review of Systems  All other systems reviewed and are negative.    Physical Exam Updated Vital Signs BP 108/73   Pulse 69   Temp 98.5 F (36.9 C) (Oral)   Resp 16   Ht 5\' 10"  (1.778 m)   Wt 90.7 kg (200 lb)   LMP 12/05/2016   SpO2 100%   BMI 28.70 kg/m   Physical Exam  Constitutional: She appears well-developed and well-nourished. No distress.  HENT:  Head: Normocephalic and atraumatic.  Mouth/Throat: Oropharynx is clear and moist. No oropharyngeal exudate.  Mucous membranes are moist  Eyes: Conjunctivae and EOM are normal. Pupils are equal, round, and reactive to light. Right eye exhibits no discharge. Left eye exhibits no discharge. No scleral icterus.  Neck: Normal range of motion. Neck supple. No JVD present. No thyromegaly present.  No lymphadenopathy  Cardiovascular: Normal rate, regular rhythm, normal heart sounds and intact distal pulses. Exam reveals no  gallop and no friction rub.  No murmur heard. No murmurs, no tachycardia, normal pulses at the radial arteries  Pulmonary/Chest: Effort normal and breath sounds normal. No respiratory distress. She has no wheezes. She has no rales.  Lungs are clear, speaks in full sentences, no distress  Abdominal: Soft. Bowel sounds are normal. She exhibits no distension and no mass. There is tenderness.  Abdomen is soft and minimally tender in the lower abdomen, no upper abdominal tenderness, no guarding or peritoneal signs  Musculoskeletal: Normal range of motion. She exhibits no edema or tenderness.  No edema  Lymphadenopathy:    She has no  cervical adenopathy.  Neurological: She is alert. Coordination normal.  Clear speech, follows commands without difficulty, memory intact  Skin: Skin is warm and dry. No rash noted. No erythema.  Psychiatric: She has a normal mood and affect. Her behavior is normal.  Nursing note and vitals reviewed.    ED Treatments / Results  Labs (all labs ordered are listed, but only abnormal results are displayed) Labs Reviewed  COMPREHENSIVE METABOLIC PANEL - Abnormal; Notable for the following components:      Result Value   Sodium 134 (*)    CO2 21 (*)    Glucose, Bld 104 (*)    All other components within normal limits  URINALYSIS, ROUTINE W REFLEX MICROSCOPIC - Abnormal; Notable for the following components:   APPearance HAZY (*)    Ketones, ur 5 (*)    All other components within normal limits  I-STAT BETA HCG BLOOD, ED ([facility], [facility], [facility] ONLY) - Abnormal; Notable for the following components:   I-stat hCG, quantitative >2,000.0 (*)    All other components within normal limits  LIPASE, BLOOD  CBC     Radiology [place] Ob Comp Less 14 Wks  Result Date: 01/15/2017 CLINICAL DATA:  Cramping and nausea for 3 days.  Pregnant patient. EXAM: OBSTETRIC <14 WK ULTRASOUND TECHNIQUE: Transabdominal ultrasound was performed for evaluation of the gestation as well as the maternal uterus and adnexal regions. COMPARISON:  None. FINDINGS: The study is significantly limited as the patient refused transvaginal imaging. There is a fluid collection within the endometrium towards the fundus with a mean diameter of 13.6 mm. No yolk sac or fetal pole is identified. The left ovary was not visualized. The right ovary is normal in appearance. No subchorionic hemorrhage or free fluid identified. IMPRESSION: 1. The study is limited as the patient refused transvaginal imaging. 2. There is a mildly elongated fluid collection within the endometrium towards the fundus with a mean diameter of 13.6 mm. Neither a yolk sac or fetal pole  are identified on transabdominal imaging. This fluid collection could represent an early gestational sac, a normal gestational sac incompletely evaluated due to lack of endovaginal imaging, an abnormal gestational sac, or a pseudo gestational sac. As a result, an IUP is not confirmed with certainty. Recommend endovaginal imaging for further characterization if the patient allows. Otherwise, recommend close follow-up. Electronically Signed   By: Gerome Samavid  Williams III M.D   On: 01/15/2017 15:59    Procedures Procedures (including critical care time)  Medications Ordered in ED Medications  metoCLOPramide (REGLAN) injection 10 mg (not administered)  sodium chloride 0.9 % bolus 1,000 mL (0 mLs Intravenous Stopped 01/15/17 1613)  metoCLOPramide (REGLAN) injection 10 mg (10 mg Intravenous Given 01/15/17 1519)     Initial Impression / Assessment and Plan / ED Course  I have reviewed the triage vital signs and the nursing notes.  Pertinent  labs & imaging results that were available during my care of the patient were reviewed by me and considered in my medical decision making (see chart for details).     Labs reviewed, patient confirmed to be pregnant with a quant over 2000, otherwise labs unremarkable including CBC, metabolic panel and urinalysis revealing no signs of urinary infection.  She denies vaginal bleeding or discharge but with increased lower abdominal pain will obtain pelvic ultrasound to rule out ectopic.  The patient will be given IV fluids and Reglan due to its safety profile in pregnancy.  She is agreeable to the plan.  She has a nonsurgical abdomen.  The patient has an ultrasound however she refused to have a transvaginal component, her ultrasound did not show a definite intrauterine pregnancy nor did it show a ectopic pregnancy, she states that she needs to leave and refused the transvaginal component.  She will need to follow-up closely in the outpatient setting.  She improved with  Reglan and will be given a prescription for the same.  Final Clinical Impressions(s) / ED Diagnoses   Final diagnoses:  Lower abdominal pain  Non-intractable vomiting with nausea, unspecified vomiting type    ED Discharge Orders        Ordered    metoCLOPramide (REGLAN) 10 MG tablet  Every 6 hours     01/15/17 1612       Eber Hong, MD 01/15/17 1615

## 2017-01-15 NOTE — ED Notes (Signed)
Patient transported to Ultrasound 

## 2017-01-15 NOTE — ED Triage Notes (Signed)
Onset 2 days ago N/V/D, lower abd pain- sharp, intermittant, and dizzy.  Vomiting x 6 today, diarrhea x 1.  Pt thinks she might be pregnant.

## 2017-01-16 ENCOUNTER — Encounter (HOSPITAL_COMMUNITY): Payer: Self-pay | Admitting: Emergency Medicine

## 2017-01-16 ENCOUNTER — Ambulatory Visit (HOSPITAL_COMMUNITY)
Admission: EM | Admit: 2017-01-16 | Discharge: 2017-01-16 | Disposition: A | Discharge status: Home / Self Care | Payer: Self-pay

## 2017-01-16 NOTE — ED Triage Notes (Signed)
PT reports nausea started Friday. PT reports vomiting started Saturday. PT reports a positive home preg test Saturday. PT was seen at the ER yesterday and given fluids and IV nausea meds. PT reports she is lightheaded. PT has contracting in left hand and reports hand is tingling.

## 2017-01-16 NOTE — ED Notes (Signed)
Amy yu, pa reviewed patient chart and concerns. Patient is going to ed .

## 2017-01-17 ENCOUNTER — Encounter (HOSPITAL_COMMUNITY): Payer: Self-pay | Admitting: *Deleted

## 2017-01-17 ENCOUNTER — Emergency Department (HOSPITAL_COMMUNITY)
Admission: EM | Admit: 2017-01-17 | Discharge: 2017-01-17 | Disposition: A | Discharge status: Home / Self Care | Payer: Self-pay | Attending: Emergency Medicine | Admitting: Emergency Medicine

## 2017-01-17 ENCOUNTER — Inpatient Hospital Stay (HOSPITAL_COMMUNITY): Payer: Self-pay

## 2017-01-17 ENCOUNTER — Other Ambulatory Visit: Payer: Self-pay

## 2017-01-17 ENCOUNTER — Inpatient Hospital Stay (HOSPITAL_COMMUNITY)
Admission: AD | Admit: 2017-01-17 | Discharge: 2017-01-17 | Disposition: A | Discharge status: Home / Self Care | Payer: Self-pay | Source: Ambulatory Visit | Attending: Obstetrics and Gynecology | Admitting: Obstetrics and Gynecology

## 2017-01-17 DIAGNOSIS — O219 Vomiting of pregnancy, unspecified: Secondary | ICD-10-CM | POA: Insufficient documentation

## 2017-01-17 DIAGNOSIS — Z5321 Procedure and treatment not carried out due to patient leaving prior to being seen by health care provider: Secondary | ICD-10-CM | POA: Insufficient documentation

## 2017-01-17 DIAGNOSIS — R112 Nausea with vomiting, unspecified: Secondary | ICD-10-CM | POA: Insufficient documentation

## 2017-01-17 DIAGNOSIS — Z87891 Personal history of nicotine dependence: Secondary | ICD-10-CM | POA: Insufficient documentation

## 2017-01-17 DIAGNOSIS — O3680X0 Pregnancy with inconclusive fetal viability, not applicable or unspecified: Secondary | ICD-10-CM

## 2017-01-17 DIAGNOSIS — Z3A01 Less than 8 weeks gestation of pregnancy: Secondary | ICD-10-CM | POA: Insufficient documentation

## 2017-01-17 DIAGNOSIS — Z3491 Encounter for supervision of normal pregnancy, unspecified, first trimester: Secondary | ICD-10-CM

## 2017-01-17 LAB — URINALYSIS, ROUTINE W REFLEX MICROSCOPIC
BILIRUBIN URINE: NEGATIVE
Bacteria, UA: NONE SEEN
GLUCOSE, UA: NEGATIVE mg/dL
Hgb urine dipstick: NEGATIVE
KETONES UR: 20 mg/dL — AB
LEUKOCYTES UA: NEGATIVE
NITRITE: NEGATIVE
PH: 5 (ref 5.0–8.0)
PROTEIN: 100 mg/dL — AB
Specific Gravity, Urine: 1.032 — ABNORMAL HIGH (ref 1.005–1.030)

## 2017-01-17 LAB — CBC
HCT: 40.5 % (ref 36.0–46.0)
HEMATOCRIT: 39.1 % (ref 36.0–46.0)
HEMOGLOBIN: 13.3 g/dL (ref 12.0–15.0)
Hemoglobin: 13.9 g/dL (ref 12.0–15.0)
MCH: 29.6 pg (ref 26.0–34.0)
MCH: 30.4 pg (ref 26.0–34.0)
MCHC: 34 g/dL (ref 30.0–36.0)
MCHC: 34.3 g/dL (ref 30.0–36.0)
MCV: 86.9 fL (ref 78.0–100.0)
MCV: 88.6 fL (ref 78.0–100.0)
Platelets: 315 10*3/uL (ref 150–400)
Platelets: 329 10*3/uL (ref 150–400)
RBC: 4.5 MIL/uL (ref 3.87–5.11)
RBC: 4.57 MIL/uL (ref 3.87–5.11)
RDW: 12.7 % (ref 11.5–15.5)
RDW: 12.9 % (ref 11.5–15.5)
WBC: 15.5 10*3/uL — AB (ref 4.0–10.5)
WBC: 18.4 10*3/uL — ABNORMAL HIGH (ref 4.0–10.5)

## 2017-01-17 LAB — LIPASE, BLOOD: LIPASE: 21 U/L (ref 11–51)

## 2017-01-17 LAB — COMPREHENSIVE METABOLIC PANEL
ALBUMIN: 4.7 g/dL (ref 3.5–5.0)
ALT: 16 U/L (ref 14–54)
ANION GAP: 13 (ref 5–15)
AST: 19 U/L (ref 15–41)
Alkaline Phosphatase: 44 U/L (ref 38–126)
BILIRUBIN TOTAL: 1 mg/dL (ref 0.3–1.2)
BUN: 12 mg/dL (ref 6–20)
CHLORIDE: 102 mmol/L (ref 101–111)
CO2: 18 mmol/L — ABNORMAL LOW (ref 22–32)
Calcium: 9.8 mg/dL (ref 8.9–10.3)
Creatinine, Ser: 0.74 mg/dL (ref 0.44–1.00)
GFR calc Af Amer: >60 mL/min (ref >60)
GLUCOSE: 151 mg/dL — AB (ref 65–99)
POTASSIUM: 3.1 mmol/L — AB (ref 3.5–5.1)
Sodium: 133 mmol/L — ABNORMAL LOW (ref 135–145)
TOTAL PROTEIN: 7.9 g/dL (ref 6.5–8.1)

## 2017-01-17 LAB — HCG, QUANTITATIVE, PREGNANCY: hCG, Beta Chain, Quant, S: 34434 m[IU]/mL — ABNORMAL HIGH (ref <5)

## 2017-01-17 LAB — ABO/RH: ABO/RH(D): A POS

## 2017-01-17 MED ORDER — LACTATED RINGERS IV SOLN
INTRAVENOUS | Status: DC
  Administered 2017-01-17: 15:00:00 via INTRAVENOUS

## 2017-01-17 MED ORDER — PROMETHAZINE HCL 25 MG/ML IJ SOLN
12.5000 mg | Freq: Once | INTRAMUSCULAR | Status: AC
  Administered 2017-01-17: 12.5 mg via INTRAVENOUS
  Filled 2017-01-17: qty 1

## 2017-01-17 MED ORDER — LACTATED RINGERS IV BOLUS (SEPSIS)
1000.0000 mL | Freq: Once | INTRAVENOUS | Status: AC
  Administered 2017-01-17: 1000 mL via INTRAVENOUS

## 2017-01-17 MED ORDER — METOCLOPRAMIDE HCL 10 MG PO TABS: 10.0000 mg | ORAL_TABLET | Freq: Every day | ORAL | 0 refills | Status: DC

## 2017-01-17 MED ORDER — PROMETHAZINE HCL 25 MG PO TABS: 12.5000 mg | ORAL_TABLET | Freq: Every evening | ORAL | 0 refills | Status: DC | PRN

## 2017-01-17 MED ORDER — ONDANSETRON 4 MG PO TBDP
4.0000 mg | ORAL_TABLET | Freq: Once | ORAL | Status: AC | PRN
  Administered 2017-01-17: 4 mg via ORAL
  Filled 2017-01-17: qty 1

## 2017-01-17 NOTE — MAU Note (Signed)
Pt reports since Friday pm she has not been able to keep anything down. Positive preg test. States she is vomiting all day long. Was given Rx reglan and states it is not helping.

## 2017-01-17 NOTE — ED Triage Notes (Signed)
lmp oct 8th . C/o nausea vomiting onset Friday. States she was seen at ED on fri and sat both however isn't getting better

## 2017-01-17 NOTE — ED Notes (Signed)
No answer for vital recheck 

## 2017-01-17 NOTE — MAU Provider Note (Signed)
Chief Complaint: Nausea and Emesis   First Provider Initiated Contact with Patient 01/17/17 1319      SUBJECTIVE HPI: Brittany Bernard is a 26 y.o. G2P1001 at 2575w1d by LMP who presents to maternity admissions reporting nausea and vomiting since Friday. Was [facility] in [facility] where she found out she was pregnant. They performed an US but patient refused the transvaginal US in order to confirm an IUP, patient states she was in shock about what was occurring. She reports that they prescribed Reglan with little relief of symptoms. She reports that she has been unable to eat since Friday and only able to keep little liquid down since Saturday afternoon. States she has vomited 12 times today. Reports epigastric pain from constant vomiting. She denies vaginal bleeding, vaginal itching/burning, urinary symptoms, h/a, dizziness, n/v, or fever/chills.     History reviewed. No pertinent past medical history. Past Surgical History:  Procedure Laterality Date  . TONSILLECTOMY     Social History   Socioeconomic History  . Marital status: Single    Spouse name: Not on file  . Number of children: Not on file  . Years of education: Not on file  . Highest education level: Not on file  Social Needs  . Financial resource strain: Not on file  . Food insecurity - worry: Not on file  . Food insecurity - inability: Not on file  . Transportation needs - medical: Not on file  . Transportation needs - non-medical: Not on file  Occupational History  . Not on file  Tobacco Use  . Smoking status: Former Smoker    Packs/day: 0.50    Last attempt to quit: 02/14/2016    Years since quitting: 0.9  . Smokeless tobacco: Never Used  Substance and Sexual Activity  . Alcohol use: Yes    Comment: social   . Drug use: Yes    Types: Marijuana    Comment: Daily  . Sexual activity: Not on file  Other Topics Concern  . Not on file  Social History Narrative  . Not on file   No current  facility-administered medications on file prior to encounter.    Current Outpatient Medications on File Prior to Encounter  Medication Sig Dispense Refill  . metoCLOPramide (REGLAN) 10 MG tablet Take 1 tablet (10 mg total) every 6 (six) hours by mouth. 30 tablet 0   No Known Allergies  ROS:  Review of Systems  Constitutional: Negative for chills, diaphoresis, fatigue and fever.  Respiratory: Negative for cough, chest tightness, shortness of breath and wheezing.   Cardiovascular: Negative for chest pain and leg swelling.  Gastrointestinal: Positive for nausea and vomiting. Negative for abdominal pain, constipation and diarrhea.  Genitourinary: Negative for difficulty urinating, dysuria, flank pain, frequency, pelvic pain, vaginal bleeding, vaginal discharge and vaginal pain.  Musculoskeletal: Negative for back pain and neck pain.  Psychiatric/Behavioral: Negative for agitation, behavioral problems and confusion.   I have reviewed patient's Past Medical Hx, Surgical Hx, Family Hx, Social Hx, medications and allergies.   Physical Exam   Patient Vitals for the past 24 hrs:  BP Temp Temp src Pulse Resp SpO2 Height Weight  01/17/17 1601 117/72 98.6 F (37 C) Oral 75 18 100 % - -  01/17/17 1259 123/79 98.3 F (36.8 C) - (!) 105 17 100 % 5\' 10"  (1.778 m) 195 lb (88.5 kg)   Constitutional: Well-developed, well-nourished female in no acute distress.  Cardiovascular: normal rate Respiratory: normal effort GI: Abd soft,  non-tender. Pos BS x 4 MS: Extremities nontender, no edema, normal ROM Neurologic: Alert and oriented x 4.  GU: Neg CVAT.  LAB RESULTS Results for orders placed or performed during the hospital encounter of 01/17/17 (from the past 24 hour(s))  Urinalysis, Routine w reflex microscopic     Status: Abnormal   Collection Time: 01/17/17 12:55 PM  Result Value Ref Range   Color, Urine YELLOW YELLOW   APPearance HAZY (A) CLEAR   Specific Gravity, Urine 1.032 (H) 1.005 - 1.030    pH 5.0 5.0 - 8.0   Glucose, UA NEGATIVE NEGATIVE mg/dL   Hgb urine dipstick NEGATIVE NEGATIVE   Bilirubin Urine NEGATIVE NEGATIVE   Ketones, ur 20 (A) NEGATIVE mg/dL   Protein, ur 960 (A) NEGATIVE mg/dL   Nitrite NEGATIVE NEGATIVE   Leukocytes, UA NEGATIVE NEGATIVE   RBC / HPF 6-30 0 - 5 RBC/hpf   WBC, UA 6-30 0 - 5 WBC/hpf   Bacteria, UA NONE SEEN NONE SEEN   Squamous Epithelial / LPF 0-5 (A) NONE SEEN   Mucus PRESENT   hCG, quantitative, pregnancy     Status: Abnormal   Collection Time: 01/17/17  2:15 PM  Result Value Ref Range   hCG, Beta Chain, Quant, S 34,434 (H) <5 mIU/mL  CBC     Status: Abnormal   Collection Time: 01/17/17  2:15 PM  Result Value Ref Range   WBC 18.4 (H) 4.0 - 10.5 K/uL   RBC 4.57 3.87 - 5.11 MIL/uL   Hemoglobin 13.9 12.0 - 15.0 g/dL   HCT 45.4 09.8 - 11.9 %   MCV 88.6 78.0 - 100.0 fL   MCH 30.4 26.0 - 34.0 pg   MCHC 34.3 30.0 - 36.0 g/dL   RDW 14.7 82.9 - 56.2 %   Platelets 329 150 - 400 K/uL  ABO/Rh     Status: None (Preliminary result)   Collection Time: 01/17/17  2:15 PM  Result Value Ref Range   ABO/RH(D) A POS       IMAGING US Ob Comp Less 14 Wks  Result Date: 01/15/2017 CLINICAL DATA:  Cramping and nausea for 3 days.  Pregnant patient. EXAM: OBSTETRIC <14 WK ULTRASOUND TECHNIQUE: Transabdominal ultrasound was performed for evaluation of the gestation as well as the maternal uterus and adnexal regions. COMPARISON:  None. FINDINGS: The study is significantly limited as the patient refused transvaginal imaging. There is a fluid collection within the endometrium towards the fundus with a mean diameter of 13.6 mm. No yolk sac or fetal pole is identified. The left ovary was not visualized. The right ovary is normal in appearance. No subchorionic hemorrhage or free fluid identified. IMPRESSION: 1. The study is limited as the patient refused transvaginal imaging. 2. There is a mildly elongated fluid collection within the endometrium towards the fundus  with a mean diameter of 13.6 mm. Neither a yolk sac or fetal pole are identified on transabdominal imaging. This fluid collection could represent an early gestational sac, a normal gestational sac incompletely evaluated due to lack of endovaginal imaging, an abnormal gestational sac, or a pseudo gestational sac. As a result, an IUP is not confirmed with certainty. Recommend endovaginal imaging for further characterization if the patient allows. Otherwise, recommend close follow-up. Electronically Signed   By: Gerome Sam III M.D   On: 01/15/2017 15:59   US Ob Transvaginal  Result Date: 01/17/2017 CLINICAL DATA:  Pregnancy of unknown location EXAM: TRANSVAGINAL OB ULTRASOUND TECHNIQUE: Transvaginal ultrasound was performed for complete evaluation of the  gestation as well as the maternal uterus, adnexal regions, and pelvic cul-de-sac. COMPARISON:  Transabdominal OB ultrasound of 01/15/2017 FINDINGS: Intrauterine gestational sac: Present Yolk sac:  Present Embryo:  Present Cardiac Activity: Present Heart Rate: 103 bpm CRL:   2.4  mm   5 w 5 d                  US EDC: 09/14/2017 Subchorionic hemorrhage:  None visualized. Maternal uterus/adnexae: RIGHT ovary normal size and morphology 1.5 x 3.2 x 2.3 cm. LEFT ovary measures 2.2 x 2.4 x 2.2 cm and contains a probable corpus luteal cyst. No free pelvic fluid or adnexal masses IMPRESSION: Single live intrauterine gestation measured at 5 weeks 5 days EGA by crown-rump length. No acute abnormalities. Electronically Signed   By: Ulyses SouthwardMark  Boles M.D.   On: 01/17/2017 15:20    MAU Management/MDM: Ordered labs and reviewed results.   CBC ABO/Rh HCG  Treatments in MAU included Lactated ringers IV fluid bolus with 12.5mg  Phenergan IV.  US abdominal and transvaginal Pt discharged with no precautions and list of safe medications to take during pregnancy [facility].  ASSESSMENT 1. Normal intrauterine pregnancy on prenatal ultrasound in first  trimester   2. Pregnancy of unknown anatomic location   3. Nausea and vomiting during pregnancy     PLAN Discharge home Rx for Phenergan sent to pharmacy  Schedule prenatal appointment once decides on office   Allergies as of 01/17/2017   No Known Allergies     Medication List    TAKE these medications   metoCLOPramide 10 MG tablet Commonly known as:  REGLAN Take 1 tablet (10 mg total) by mouth daily before breakfast. What changed:  when to take this   promethazine 25 MG tablet Commonly known as:  PHENERGAN Take 0.5 tablets (12.5 mg total) by mouth at bedtime as needed for nausea or vomiting.      Steward DroneVeronica Delrico Minehart  Certified Nurse-Midwife 01/17/2017  1:29 PM

## 2017-01-18 ENCOUNTER — Other Ambulatory Visit: Payer: Self-pay

## 2017-01-18 ENCOUNTER — Inpatient Hospital Stay (HOSPITAL_COMMUNITY)
Admission: AD | Admit: 2017-01-18 | Discharge: 2017-01-18 | Disposition: A | Discharge status: Home / Self Care | Payer: Self-pay | Source: Ambulatory Visit | Attending: Obstetrics & Gynecology | Admitting: Obstetrics & Gynecology

## 2017-01-18 ENCOUNTER — Encounter (HOSPITAL_COMMUNITY): Payer: Self-pay

## 2017-01-18 DIAGNOSIS — Z87891 Personal history of nicotine dependence: Secondary | ICD-10-CM | POA: Insufficient documentation

## 2017-01-18 DIAGNOSIS — K219 Gastro-esophageal reflux disease without esophagitis: Secondary | ICD-10-CM

## 2017-01-18 DIAGNOSIS — O99611 Diseases of the digestive system complicating pregnancy, first trimester: Secondary | ICD-10-CM | POA: Insufficient documentation

## 2017-01-18 DIAGNOSIS — E860 Dehydration: Secondary | ICD-10-CM

## 2017-01-18 DIAGNOSIS — Z3A01 Less than 8 weeks gestation of pregnancy: Secondary | ICD-10-CM

## 2017-01-18 DIAGNOSIS — O210 Mild hyperemesis gravidarum: Secondary | ICD-10-CM

## 2017-01-18 DIAGNOSIS — O99281 Endocrine, nutritional and metabolic diseases complicating pregnancy, first trimester: Secondary | ICD-10-CM | POA: Insufficient documentation

## 2017-01-18 LAB — COMPREHENSIVE METABOLIC PANEL
ALK PHOS: 45 U/L (ref 38–126)
ALT: 16 U/L (ref 14–54)
AST: 16 U/L (ref 15–41)
Albumin: 4.7 g/dL (ref 3.5–5.0)
Anion gap: 11 (ref 5–15)
BUN: 12 mg/dL (ref 6–20)
CALCIUM: 9.8 mg/dL (ref 8.9–10.3)
CHLORIDE: 98 mmol/L — AB (ref 101–111)
CO2: 23 mmol/L (ref 22–32)
CREATININE: 0.62 mg/dL (ref 0.44–1.00)
GFR calc Af Amer: >60 mL/min (ref >60)
GFR calc non Af Amer: >60 mL/min (ref >60)
GLUCOSE: 109 mg/dL — AB (ref 65–99)
Potassium: 3.3 mmol/L — ABNORMAL LOW (ref 3.5–5.1)
SODIUM: 132 mmol/L — AB (ref 135–145)
Total Bilirubin: 0.9 mg/dL (ref 0.3–1.2)
Total Protein: 7.9 g/dL (ref 6.5–8.1)

## 2017-01-18 LAB — URINALYSIS, ROUTINE W REFLEX MICROSCOPIC
Bilirubin Urine: NEGATIVE
Glucose, UA: NEGATIVE mg/dL
HGB URINE DIPSTICK: NEGATIVE
Ketones, ur: 20 mg/dL — AB
LEUKOCYTES UA: NEGATIVE
Nitrite: NEGATIVE
PROTEIN: NEGATIVE mg/dL
SPECIFIC GRAVITY, URINE: 1.018 (ref 1.005–1.030)
pH: 7 (ref 5.0–8.0)

## 2017-01-18 MED ORDER — RANITIDINE HCL 150 MG PO TABS: 150.0000 mg | ORAL_TABLET | Freq: Every day | ORAL | 1 refills | Status: DC

## 2017-01-18 MED ORDER — LACTATED RINGERS IV BOLUS (SEPSIS)
1000.0000 mL | Freq: Once | INTRAVENOUS | Status: AC
  Administered 2017-01-18: 1000 mL via INTRAVENOUS

## 2017-01-18 MED ORDER — PROCHLORPERAZINE MALEATE 10 MG PO TABS: 5.0000 mg | ORAL_TABLET | Freq: Four times a day (QID) | ORAL | 0 refills | Status: DC | PRN

## 2017-01-18 MED ORDER — FAMOTIDINE IN NACL 20-0.9 MG/50ML-% IV SOLN
20.0000 mg | Freq: Once | INTRAVENOUS | Status: AC
  Administered 2017-01-18: 20 mg via INTRAVENOUS
  Filled 2017-01-18: qty 50

## 2017-01-18 MED ORDER — SCOPOLAMINE 1 MG/3DAYS TD PT72: 1.0000 | MEDICATED_PATCH | TRANSDERMAL | 0 refills | Status: DC

## 2017-01-18 MED ORDER — SCOPOLAMINE 1 MG/3DAYS TD PT72
1.0000 | MEDICATED_PATCH | Freq: Once | TRANSDERMAL | Status: DC
  Administered 2017-01-18: 1.5 mg via TRANSDERMAL
  Filled 2017-01-18: qty 1

## 2017-01-18 MED ORDER — PROCHLORPERAZINE EDISYLATE 5 MG/ML IJ SOLN
10.0000 mg | Freq: Once | INTRAMUSCULAR | Status: AC
  Administered 2017-01-18: 10 mg via INTRAVENOUS
  Filled 2017-01-18: qty 2

## 2017-01-18 MED ORDER — M.V.I. ADULT IV INJ
INJECTION | Freq: Once | INTRAVENOUS | Status: AC
  Administered 2017-01-18: 22:00:00 via INTRAVENOUS
  Filled 2017-01-18: qty 1000

## 2017-01-18 NOTE — MAU Provider Note (Signed)
History     CSN: [id number]  Arrival date and time: 01/18/17 9735   First Provider Initiated Contact with Patient 01/18/17 1957      Chief Complaint  Patient presents with  . Emesis  . Abdominal Pain   HPI   Ms.Brittany Bernard is a 26 y.o. female G2P1001 @ 34w2dhere in MAU with nausea and vomiting. States she cannot keep anything down. States this is her fourth visit to an urgent care of ER in the last week. She has tried promethazine and reglan which is not working, does not think Zofran works either.  States she cannot keep anything down, and the vomiting is keeping her up at night.   8 lb weight loss in 3 days.   OB History    Gravida Para Term Preterm AB Living   _0 SAB TAB Ectopic Multiple Live Births           1      No past medical history on file.  Past Surgical History:  Procedure Laterality Date  . TONSILLECTOMY      No family history on file.  Social History   Tobacco Use  . Smoking status: Former Smoker    Packs/day: 0.50    Last attempt to quit: 02/14/2016    Years since quitting: 0.9  . Smokeless tobacco: Never Used  Substance Use Topics  . Alcohol use: Yes    Comment: social   . Drug use: Yes    Types: Marijuana    Comment: Daily    Allergies: No Known Allergies  Medications Prior to Admission  Medication Sig Dispense Refill Last Dose  . promethazine (PHENERGAN) 25 MG tablet Take 0.5 tablets (12.5 mg total) by mouth at bedtime as needed for nausea or vomiting. 30 tablet 0 01/18/2017 at Unknown time  . metoCLOPramide (REGLAN) 10 MG tablet Take 1 tablet (10 mg total) by mouth daily before breakfast. 30 tablet 0    Results for orders placed or performed during the hospital encounter of 01/18/17 (from the past 48 hour(s))  Urinalysis, Routine w reflex microscopic     Status: Abnormal   Collection Time: 01/18/17  7:17 PM  Result Value Ref Range   Color, Urine YELLOW YELLOW   APPearance CLEAR CLEAR   Specific Gravity, Urine 1.018  1.005 - 1.030   pH 7.0 5.0 - 8.0   Glucose, UA NEGATIVE NEGATIVE mg/dL   Hgb urine dipstick NEGATIVE NEGATIVE   Bilirubin Urine NEGATIVE NEGATIVE   Ketones, ur 20 (A) NEGATIVE mg/dL   Protein, ur NEGATIVE NEGATIVE mg/dL   Nitrite NEGATIVE NEGATIVE   Leukocytes, UA NEGATIVE NEGATIVE  Comprehensive metabolic panel     Status: Abnormal   Collection Time: 01/18/17  8:05 PM  Result Value Ref Range   Sodium 132 (L) 135 - 145 mmol/L   Potassium 3.3 (L) 3.5 - 5.1 mmol/L   Chloride 98 (L) 101 - 111 mmol/L   CO2 23 22 - 32 mmol/L   Glucose, Bld 109 (H) 65 - 99 mg/dL   BUN 12 6 - 20 mg/dL   Creatinine, Ser 0.62 0.44 - 1.00 mg/dL   Calcium 9.8 8.9 - 10.3 mg/dL   Total Protein 7.9 6.5 - 8.1 g/dL   Albumin 4.7 3.5 - 5.0 g/dL   AST 16 15 - 41 U/L   ALT 16 14 - 54 U/L   Alkaline Phosphatase 45 38 - 126 U/L   Total Bilirubin 0.9 0.3 -  1.2 mg/dL   GFR calc non Af Amer >60 >60 mL/min   GFR calc Af Amer >60 >60 mL/min    Comment: (NOTE) The eGFR has been calculated using the CKD EPI equation. This calculation has not been validated in all clinical situations. eGFR's persistently <60 mL/min signify possible Chronic Kidney Disease.    Anion gap 11 5 - 15    Review of Systems  Gastrointestinal: Positive for abdominal pain (Upper and lower, pain comes and goes. + burning in chest ).  Genitourinary: Negative for vaginal bleeding and vaginal discharge.   Physical Exam   Blood pressure 115/75, pulse 94, temperature 98.1 F (36.7 C), temperature source Oral, resp. rate 18, height _0  (1.778 m), weight 192 lb (87.1 kg), last menstrual period 12/05/2016, SpO2 100 %.  Physical Exam  Constitutional: She is oriented to person, place, and time. Vital signs are normal. She appears well-developed. She has a sickly appearance. She appears ill.  Respiratory: Effort normal.  Musculoskeletal: Normal range of motion.  Neurological: She is alert and oriented to person, place, and time.  Skin: Skin is  warm.  Psychiatric: Her behavior is normal.   Results for orders placed or performed during the hospital encounter of 01/18/17 (from the past 24 hour(s))  Urinalysis, Routine w reflex microscopic     Status: Abnormal   Collection Time: 01/18/17  7:17 PM  Result Value Ref Range   Color, Urine YELLOW YELLOW   APPearance CLEAR CLEAR   Specific Gravity, Urine 1.018 1.005 - 1.030   pH 7.0 5.0 - 8.0   Glucose, UA NEGATIVE NEGATIVE mg/dL   Hgb urine dipstick NEGATIVE NEGATIVE   Bilirubin Urine NEGATIVE NEGATIVE   Ketones, ur 20 (A) NEGATIVE mg/dL   Protein, ur NEGATIVE NEGATIVE mg/dL   Nitrite NEGATIVE NEGATIVE   Leukocytes, UA NEGATIVE NEGATIVE  Comprehensive metabolic panel     Status: Abnormal   Collection Time: 01/18/17  8:05 PM  Result Value Ref Range   Sodium 132 (L) 135 - 145 mmol/L   Potassium 3.3 (L) 3.5 - 5.1 mmol/L   Chloride 98 (L) 101 - 111 mmol/L   CO2 23 22 - 32 mmol/L   Glucose, Bld 109 (H) 65 - 99 mg/dL   BUN 12 6 - 20 mg/dL   Creatinine, Ser 0.62 0.44 - 1.00 mg/dL   Calcium 9.8 8.9 - 10.3 mg/dL   Total Protein 7.9 6.5 - 8.1 g/dL   Albumin 4.7 3.5 - 5.0 g/dL   AST 16 15 - 41 U/L   ALT 16 14 - 54 U/L   Alkaline Phosphatase 45 38 - 126 U/L   Total Bilirubin 0.9 0.3 - 1.2 mg/dL   GFR calc non Af Amer >60 >60 mL/min   GFR calc Af Amer >60 >60 mL/min   Anion gap 11 5 - 15   MAU Course  Procedures  None  MDM  UA shows ketones  LR bolus X 1 MVI bolus X 1 Pepcid for GERD symptoms  Compazine 10 mg IV Scopolamine patch placed  CMP Report given to Julianne Handler CNM who resumes care of the patient. Patient getting fluids at this time.   Lezlie Lye, NP 01/18/2017 8:56 PM  Labs reviewed. Pt feeling better. No emesis since meds. Tolerating some po fluids. Stable for discharge home.  Assessment and Plan   1. [redacted] weeks gestation of pregnancy   2. Morning sickness   3. Dehydration   4. Gastroesophageal reflux disease without esophagitis  Discharge  home Follow up with OB provider of choice to start care in 4 weeks Rx Compazine Rx Scopolamine patch HEG diet  Allergies as of 01/18/2017   No Known Allergies     Medication List    STOP taking these medications   metoCLOPramide 10 MG tablet Commonly known as:  REGLAN   promethazine 25 MG tablet Commonly known as:  PHENERGAN     TAKE these medications   prochlorperazine 10 MG tablet Commonly known as:  COMPAZINE Take 0.5-1 tablets (5-10 mg total) by mouth every 6 (six) hours as needed for nausea or vomiting.   ranitidine 150 MG tablet Commonly known as:  ZANTAC Take 1 tablet (150 mg total) by mouth at bedtime. May increase to twice a day if needed   scopolamine 1 MG/3DAYS Commonly known as:  TRANSDERM-SCOP Place 1 patch (1.5 mg total) onto the skin every 3 (three) days.      Julianne Handler, CNM  01/18/2017 10:07 PM

## 2017-01-18 NOTE — MAU Note (Signed)
Pt here with c/o continued vomiting and abdominal pain. She was seen here last night. Meds given are "not working."

## 2017-01-18 NOTE — Discharge Instructions (Signed)
Eating Plan for Hyperemesis Gravidarum °Hyperemesis gravidarum is a severe form of morning sickness. Because this condition causes severe nausea and vomiting, it can lead to dehydration, malnutrition, and weight loss. One way to lessen the symptoms of nausea and vomiting is to follow the eating plan for hyperemesis gravidarum. It is often used along with prescribed medicines to control your symptoms. °What can I do to relieve my symptoms? °Listen to your body. Everyone is different and has different preferences. Find what works best for you. Take any of the following actions that are helpful to you: °· Eat and drink slowly. °· Eat 5-6 small meals daily instead of 3 large meals. °· Eat crackers before you get out of bed in the morning. °· Try having a snack in the middle of the night. °· Starchy foods are usually tolerated well. Examples include cereal, toast, bread, potatoes, pasta, rice, and pretzels. °· Ginger may help with nausea. Add ¼ tsp ground ginger to hot tea or choose ginger tea. °· Try drinking 100% fruit juice or an electrolyte drink. An electrolyte drink contains sodium, potassium, and chloride. °· Continue to take your prenatal vitamins as told by your health care provider. If you are having trouble taking your prenatal vitamins, talk with your health care provider about different options. °· Include at least 1 serving of protein with your meals and snacks. Protein options include meats or poultry, beans, nuts, eggs, and yogurt. Try eating a protein-rich snack before bed. Examples of these snacks include cheese and crackers or half of a peanut butter or turkey sandwich. °· Consider eliminating foods that trigger your symptoms. These may include spicy foods, coffee, high-fat foods, very sweet foods, and acidic foods. °· Try meals that have more protein combined with bland, salty, lower-fat, and dry foods, such as nuts, seeds, pretzels, crackers, and cereal. °· Talk with your healthcare provider about  starting a supplement of vitamin B6. °· Have fluids that are cold, clear, and carbonated or sour. Examples include lemonade, ginger ale, lemon-lime soda, ice water, and sparkling water. °· Try lemon or mint tea. °· Try brushing your teeth or using a mouth rinse after meals. ° °What should I avoid to reduce my symptoms? °Avoiding some of the following things may help reduce your symptoms. °· Foods with strong smells. Try eating meals in well-ventilated areas that are free of odors. °· Drinking water or other beverages with meals. Try not to drink anything during the 30 minutes before and after your meals. °· Drinking more than 1 cup of fluid at a time. Sometimes using a straw helps. °· Fried or high-fat foods, such as butter and cream sauces. °· Spicy foods. °· Skipping meals as best as you can. Nausea can be more intense on an empty stomach. If you cannot tolerate food at that time, do not force it. Try sucking on ice chips or other frozen items, and make up for missed calories later. °· Lying down within 2 hours after eating. °· Environmental triggers. These may include smoky rooms, closed spaces, rooms with strong smells, warm or humid places, overly loud and noisy rooms, and rooms with motion or flickering lights. °· Quick and sudden changes in your movement. ° °This information is not intended to replace advice given to you by your health care provider. Make sure you discuss any questions you have with your health care provider. °Document Released: 12/12/2006 Document Revised: 10/14/2015 Document Reviewed: 09/15/2015 °Elsevier Interactive Patient Education © 2018 [employer] °Morning Sickness °Morning sickness   is when you feel sick to your stomach (nauseous) during pregnancy. You may feel sick to your stomach and throw up (vomit). You may feel sick in the morning, but you can feel this way any time of day. Some women feel very sick to their stomach and cannot stop throwing up (hyperemesis gravidarum). °Follow  these instructions at home: °· Only take medicines as told by your doctor. °· Take multivitamins as told by your doctor. Taking multivitamins before getting pregnant can stop or lessen the harshness of morning sickness. °· Eat dry toast or unsalted crackers before getting out of bed. °· Eat 5 to 6 small meals a day. °· Eat dry and bland foods like rice and baked potatoes. °· Do not drink liquids with meals. Drink between meals. °· Do not eat greasy, fatty, or spicy foods. °· Have someone cook for you if the smell of food causes you to feel sick or throw up. °· If you feel sick to your stomach after taking prenatal vitamins, take them at night or with a snack. °· Eat protein when you need a snack (nuts, yogurt, cheese). °· Eat unsweetened gelatins for dessert. °· Wear a bracelet used for sea sickness (acupressure wristband). °· Go to a doctor that puts thin needles into certain body points (acupuncture) to improve how you feel. °· Do not smoke. °· Use a humidifier to keep the air in your house free of odors. °· Get lots of fresh air. °Contact a doctor if: °· You need medicine to feel better. °· You feel dizzy or lightheaded. °· You are losing weight. °Get help right away if: °· You feel very sick to your stomach and cannot stop throwing up. °· You pass out (faint). °This information is not intended to replace advice given to you by your health care provider. Make sure you discuss any questions you have with your health care provider. °Document Released: 03/24/2004 Document Revised: 07/23/2015 Document Reviewed: 08/01/2012 °Elsevier Interactive Patient Education © 2017 [employer] ° °

## 2017-05-09 ENCOUNTER — Encounter (HOSPITAL_COMMUNITY): Payer: Self-pay | Admitting: Family Medicine

## 2017-05-09 ENCOUNTER — Ambulatory Visit (HOSPITAL_COMMUNITY)
Admission: EM | Admit: 2017-05-09 | Discharge: 2017-05-09 | Disposition: A | Discharge status: Home / Self Care | Payer: Medicaid Other | Attending: Family Medicine | Admitting: Family Medicine

## 2017-05-09 DIAGNOSIS — R112 Nausea with vomiting, unspecified: Secondary | ICD-10-CM

## 2017-05-09 DIAGNOSIS — R109 Unspecified abdominal pain: Secondary | ICD-10-CM

## 2017-05-09 LAB — POCT URINALYSIS DIP (DEVICE)
GLUCOSE, UA: NEGATIVE mg/dL
Hgb urine dipstick: NEGATIVE
KETONES UR: 80 mg/dL — AB
Leukocytes, UA: NEGATIVE
Nitrite: NEGATIVE
PH: 7 (ref 5.0–8.0)
PROTEIN: 100 mg/dL — AB
Specific Gravity, Urine: 1.015 (ref 1.005–1.030)
UROBILINOGEN UA: 2 mg/dL — AB (ref 0.0–1.0)

## 2017-05-09 MED ORDER — ONDANSETRON 4 MG PO TBDP
4.0000 mg | ORAL_TABLET | Freq: Once | ORAL | Status: AC
  Administered 2017-05-09: 4 mg via ORAL

## 2017-05-09 MED ORDER — ONDANSETRON 4 MG PO TBDP
ORAL_TABLET | ORAL | Status: AC
  Filled 2017-05-09: qty 1

## 2017-05-09 MED ORDER — ONDANSETRON 4 MG PO TBDP: 4.0000 mg | ORAL_TABLET | Freq: Three times a day (TID) | ORAL | 0 refills | Status: DC | PRN

## 2017-05-09 NOTE — ED Triage Notes (Signed)
Pt here for 4 days of vomiting. Reports that her daughter was previously ill with the same. sts abd pain and heart burn when she lays down.

## 2017-05-09 NOTE — ED Provider Notes (Signed)
[facility]    CSN: [id number] Arrival date & time: 05/09/17  1206     History   Chief Complaint Chief Complaint  Patient presents with  . Emesis    HPI Brittany Bernard is a 27 y.o. female no contributing past medical history presenting today with nausea and vomiting.  Also having abdominal pain that comes and goes, generally in the epigastrium area but will migrate.  States that she has been vomiting for the past 4 days.  She has had poor oral intake as she frequently vomits after eating.  She has not had a bowel movement in 4 days, previously would go daily.  Also noting she has heartburn when she is in a lying position.  Denies urinary symptoms of increased frequency, dysuria, incomplete voiding.  Did note some brown spotting yesterday.  Last menstrual period ended approximately 1 week ago.  Not on any form of birth control.  Her daughter recently had a stomach bug with nausea and vomiting last Thursday and lasted approximately 2-3 days.  Presenting today since her symptoms have persisted longer.  She is not taking anything but ibuprofen.  HPI  History reviewed. No pertinent past medical history.  Patient Active Problem List   Diagnosis Date Noted  . Bone pain 02/18/2016  . 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 TONSILLECTOMY      OB History    Gravida Para Term Preterm AB Living   2 1 1     1    SAB TAB Ectopic Multiple Live Births           1       Home Medications    Prior to Admission medications   Medication Sig Start Date End Date Taking? Authorizing Provider  ondansetron (ZOFRAN ODT) 4 MG disintegrating tablet Take 1 tablet (4 mg total) by mouth every 8 (eight) hours as needed for nausea or vomiting. 05/09/17   Wieters, Junius CreamerHallie C, PA-C    Family History History reviewed. No pertinent family history.  Social History Social History   Tobacco Use  . Smoking status: Former  Smoker    Packs/day: 0.50    Last attempt to quit: 02/14/2016    Years since quitting: 1.2  . Smokeless tobacco: Never Used  Substance Use Topics  . Alcohol use: Yes    Comment: social   . Drug use: Yes    Types: Marijuana    Comment: Daily     Allergies   Patient has no known allergies.   Review of Systems Review of Systems  Constitutional: Positive for appetite change. Negative for chills, fatigue and fever.  HENT: Negative for congestion, ear pain, rhinorrhea, sinus pressure, sore throat and trouble swallowing.   Respiratory: Positive for cough. Negative for chest tightness and shortness of breath.   Cardiovascular: Negative for chest pain.  Gastrointestinal: Positive for abdominal pain, nausea and vomiting. Negative for diarrhea.  Genitourinary: Negative for dysuria, frequency, vaginal bleeding, vaginal discharge and vaginal pain.  Musculoskeletal: Negative for myalgias.  Skin: Negative for rash.  Neurological: Positive for light-headedness. Negative for dizziness, syncope, weakness and headaches.     Physical Exam Triage Vital Signs ED Triage Vitals  Enc Vitals Group     BP 05/09/17 1333 114/70     Pulse Rate 05/09/17 1333 77     Resp 05/09/17 1333 18     Temp 05/09/17 1333 98.7 F (37.1 C)  Temp Source 05/09/17 1333 Oral     SpO2 05/09/17 1333 100 %     Weight --      Height --      Head Circumference --      Peak Flow --      Pain Score 05/09/17 1332 7     Pain Loc --      Pain Edu? --      Excl. in GC? --    No data found.  Updated Vital Signs BP 114/70   Pulse 77   Temp 98.7 F (37.1 C) (Oral)   Resp 18   LMP 05/04/2017   SpO2 100%   Breastfeeding? Unknown   Visual Acuity Right Eye Distance:   Left Eye Distance:   Bilateral Distance:    Right Eye Near:   Left Eye Near:    Bilateral Near:     Physical Exam  Constitutional: She appears well-developed and well-nourished. No distress.  HENT:  Head: Normocephalic and atraumatic.    Oral mucosa moist  Eyes: Conjunctivae are normal.  Neck: Neck supple.  Cardiovascular: Normal rate and regular rhythm.  No murmur heard. Pulmonary/Chest: Effort normal and breath sounds normal. No respiratory distress.  Abdominal: Soft. There is tenderness.  Diffuse tenderness to lower abdomen and epigastrium, negative rebound, negative Rovsing's, negative Murphy's  Musculoskeletal: She exhibits no edema.  Neurological: She is alert.  Skin: Skin is warm and dry.  Psychiatric: She has a normal mood and affect.  Nursing note and vitals reviewed.    UC Treatments / Results  Labs (all labs ordered are listed, but only abnormal results are displayed) Labs Reviewed  POCT URINALYSIS DIP (DEVICE) - Abnormal; Notable for the following components:      Result Value   Bilirubin Urine SMALL (*)    Ketones, ur 80 (*)    Protein, ur 100 (*)    Urobilinogen, UA 2.0 (*)    All other components within normal limits    EKG  EKG Interpretation None       Radiology No results found.  Procedures Procedures (including critical care time)  Medications Ordered in UC Medications  ondansetron (ZOFRAN-ODT) disintegrating tablet 4 mg (4 mg Oral Given 05/09/17 1411)     Initial Impression / Assessment and Plan / UC Course  I have reviewed the triage vital signs and the nursing notes.  Pertinent labs & imaging results that were available during my care of the patient were reviewed by me and considered in my medical decision making (see chart for details).     Patient with nausea and vomiting, likely viral given daughter with similar illness, acute onset.  Pregnancy test negative, UA did show signs of dehydration.  Provided Zofran in clinic, tolerated oral fluids after, will recommend oral rehydration with water, Pedialyte/Gatorade.  Zofran prescriptions at home, recommended to slowly transition her diet back to normal, if she is unable to tolerate fluids or develops worsening abdominal pain  or does not to begin bowel movements again after eating to get to emergency room. Discussed strict return precautions. Patient verbalized understanding and is agreeable with plan.  Patient also experiencing reflux since vomiting, will recommend Zantac 150 twice daily  Final Clinical Impressions(s) / UC Diagnoses   Final diagnoses:  Nausea and vomiting, intractability of vomiting not specified, unspecified vomiting type    ED Discharge Orders        Ordered    ondansetron (ZOFRAN ODT) 4 MG disintegrating tablet  Every 8 hours PRN  05/09/17 1445       Controlled Substance Prescriptions [place] Controlled Substance Registry consulted? Not Applicable   Lew Dawes, [place] 05/09/17 1453

## 2017-05-09 NOTE — Discharge Instructions (Signed)
Your nausea, vomiting appear to have a viral cause. Your symptoms should improve over the next week as your body continues to rid the infectious cause.  For nausea: Zofran prescribed. Begin with every 6 hours, than as you are able to hold food down, take it as needed. Start with clear liquids, then move to plain foods like bananas, rice, applesauce, toast, broth, grits, oatmeal. As those food settle okay you may transition to your normal foods. Avoid spicy and greasy foods as much as possible.  Preventing dehydration is key! You need to replace the fluid your body is expelling. Drink plenty of fluids, may use Pedialyte or sports drinks.   Please return if you are experiencing blood in your vomit or stool or experiencing dizziness, lightheadedness, extreme fatigue, increased abdominal pain.  Please also return if you are unable to tolerate oral intake, do not develop bowel movements as you are increasing your oral intake.

## 2017-05-10 ENCOUNTER — Emergency Department (HOSPITAL_COMMUNITY): Payer: Self-pay

## 2017-05-10 ENCOUNTER — Encounter (HOSPITAL_COMMUNITY): Payer: Self-pay

## 2017-05-10 ENCOUNTER — Emergency Department (HOSPITAL_COMMUNITY)
Admission: EM | Admit: 2017-05-10 | Discharge: 2017-05-10 | Disposition: A | Discharge status: Home / Self Care | Payer: Self-pay | Attending: Emergency Medicine | Admitting: Emergency Medicine

## 2017-05-10 ENCOUNTER — Other Ambulatory Visit: Payer: Self-pay

## 2017-05-10 DIAGNOSIS — Z87891 Personal history of nicotine dependence: Secondary | ICD-10-CM | POA: Insufficient documentation

## 2017-05-10 DIAGNOSIS — E876 Hypokalemia: Secondary | ICD-10-CM | POA: Insufficient documentation

## 2017-05-10 DIAGNOSIS — E860 Dehydration: Secondary | ICD-10-CM | POA: Insufficient documentation

## 2017-05-10 DIAGNOSIS — R112 Nausea with vomiting, unspecified: Secondary | ICD-10-CM | POA: Insufficient documentation

## 2017-05-10 DIAGNOSIS — R1013 Epigastric pain: Secondary | ICD-10-CM | POA: Insufficient documentation

## 2017-05-10 LAB — COMPREHENSIVE METABOLIC PANEL
ALT: 15 U/L (ref 14–54)
ANION GAP: 11 (ref 5–15)
AST: 20 U/L (ref 15–41)
Albumin: 4.5 g/dL (ref 3.5–5.0)
Alkaline Phosphatase: 50 U/L (ref 38–126)
BUN: 11 mg/dL (ref 6–20)
CHLORIDE: 96 mmol/L — AB (ref 101–111)
CO2: 25 mmol/L (ref 22–32)
Calcium: 9.5 mg/dL (ref 8.9–10.3)
Creatinine, Ser: 1.01 mg/dL — ABNORMAL HIGH (ref 0.44–1.00)
Glucose, Bld: 134 mg/dL — ABNORMAL HIGH (ref 65–99)
POTASSIUM: 2.7 mmol/L — AB (ref 3.5–5.1)
Sodium: 132 mmol/L — ABNORMAL LOW (ref 135–145)
Total Bilirubin: 0.9 mg/dL (ref 0.3–1.2)
Total Protein: 8.2 g/dL — ABNORMAL HIGH (ref 6.5–8.1)

## 2017-05-10 LAB — CBC
HEMATOCRIT: 45.4 % (ref 36.0–46.0)
HEMOGLOBIN: 15.6 g/dL — AB (ref 12.0–15.0)
MCH: 30.4 pg (ref 26.0–34.0)
MCHC: 34.4 g/dL (ref 30.0–36.0)
MCV: 88.3 fL (ref 78.0–100.0)
Platelets: 378 10*3/uL (ref 150–400)
RBC: 5.14 MIL/uL — AB (ref 3.87–5.11)
RDW: 12.8 % (ref 11.5–15.5)
WBC: 13.1 10*3/uL — AB (ref 4.0–10.5)

## 2017-05-10 LAB — I-STAT BETA HCG BLOOD, ED (MC, WL, AP ONLY): I-stat hCG, quantitative: <5 m[IU]/mL (ref <5)

## 2017-05-10 LAB — URINALYSIS, ROUTINE W REFLEX MICROSCOPIC
BILIRUBIN URINE: NEGATIVE
Glucose, UA: NEGATIVE mg/dL
KETONES UR: 20 mg/dL — AB
LEUKOCYTES UA: NEGATIVE
Nitrite: NEGATIVE
Protein, ur: 100 mg/dL — AB
Specific Gravity, Urine: 1.016 (ref 1.005–1.030)
pH: 6 (ref 5.0–8.0)

## 2017-05-10 LAB — LIPASE, BLOOD: LIPASE: 85 U/L — AB (ref 11–51)

## 2017-05-10 MED ORDER — PROMETHAZINE HCL 25 MG PO TABS: 25.0000 mg | ORAL_TABLET | Freq: Four times a day (QID) | ORAL | 0 refills | Status: DC | PRN

## 2017-05-10 MED ORDER — ONDANSETRON HCL 4 MG/2ML IJ SOLN
4.0000 mg | Freq: Once | INTRAMUSCULAR | Status: AC
Start: 2017-05-10 — End: 2017-05-10
  Administered 2017-05-10: 4 mg via INTRAVENOUS
  Filled 2017-05-10: qty 2

## 2017-05-10 MED ORDER — SODIUM CHLORIDE 0.9 % IV BOLUS (SEPSIS)
1000.0000 mL | Freq: Once | INTRAVENOUS | Status: AC
  Administered 2017-05-10: 1000 mL via INTRAVENOUS

## 2017-05-10 MED ORDER — GI COCKTAIL ~~LOC~~
30.0000 mL | Freq: Once | ORAL | Status: AC
  Administered 2017-05-10: 30 mL via ORAL
  Filled 2017-05-10: qty 30

## 2017-05-10 MED ORDER — IOPAMIDOL (ISOVUE-300) INJECTION 61%
INTRAVENOUS | Status: AC
  Administered 2017-05-10: 100 mL
  Filled 2017-05-10: qty 100

## 2017-05-10 MED ORDER — PROMETHAZINE HCL 25 MG/ML IJ SOLN
25.0000 mg | Freq: Once | INTRAMUSCULAR | Status: AC
  Administered 2017-05-10: 25 mg via INTRAVENOUS
  Filled 2017-05-10: qty 1

## 2017-05-10 MED ORDER — POTASSIUM CHLORIDE CRYS ER 20 MEQ PO TBCR: 20.0000 meq | EXTENDED_RELEASE_TABLET | Freq: Every day | ORAL | 0 refills | Status: DC

## 2017-05-10 MED ORDER — MORPHINE SULFATE (PF) 4 MG/ML IV SOLN
4.0000 mg | Freq: Once | INTRAVENOUS | Status: AC
  Administered 2017-05-10: 4 mg via INTRAVENOUS
  Filled 2017-05-10: qty 1

## 2017-05-10 MED ORDER — FAMOTIDINE IN NACL 20-0.9 MG/50ML-% IV SOLN
20.0000 mg | Freq: Once | INTRAVENOUS | Status: AC
  Administered 2017-05-10: 20 mg via INTRAVENOUS
  Filled 2017-05-10: qty 50

## 2017-05-10 NOTE — ED Provider Notes (Addendum)
[facility] EMERGENCY DEPARTMENT Provider Note   CSN: [id number] Arrival date & time: 05/10/17  7829     History   Chief Complaint Chief Complaint  Patient presents with  . Emesis    HPI Brittany Bernard is a 27 y.o. female.  Pt presents to the ED today with n/v since Friday, March 8.  Pt said she has diffuse abd pain as well.  She said her daughter had the same thing last week, but her sx only lasted 2 days.  Pt did go to urgent care yesterday and was given rx for zofran.  Sx are not improving, so she came in today.      History reviewed. No pertinent past medical history.  Patient Active Problem List   Diagnosis Date Noted  . Bone pain 02/18/2016  . 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 TONSILLECTOMY      OB History    Gravida Para Term Preterm AB Living   2 1 1     1    SAB TAB Ectopic Multiple Live Births           1       Home Medications    Prior to Admission medications   Medication Sig Start Date End Date Taking? Authorizing Provider  ondansetron (ZOFRAN ODT) 4 MG disintegrating tablet Take 1 tablet (4 mg total) by mouth every 8 (eight) hours as needed for nausea or vomiting. 05/09/17   Wieters, Hallie C, PA-C  potassium chloride SA (K-DUR,KLOR-CON) 20 MEQ tablet Take 1 tablet (20 mEq total) by mouth daily. 05/10/17   Jacalyn Lefevre, MD  promethazine (PHENERGAN) 25 MG tablet Take 1 tablet (25 mg total) by mouth every 6 (six) hours as needed for nausea or vomiting. 05/10/17   Jacalyn Lefevre, MD    Family History No family history on file.  Social History Social History   Tobacco Use  . Smoking status: Former Smoker    Packs/day: 0.50    Last attempt to quit: 02/14/2016    Years since quitting: 1.2  . Smokeless tobacco: Never Used  Substance Use Topics  . Alcohol use: Yes    Comment: social   . Drug use: Yes    Types: Marijuana    Comment: Daily      Allergies   Patient has no known allergies.   Review of Systems Review of Systems  Gastrointestinal: Positive for nausea and vomiting.  All other systems reviewed and are negative.    Physical Exam Updated Vital Signs BP (!) 127/94   Pulse 65   Temp (!) 97.3 F (36.3 C) (Oral)   Resp 16   Ht 5\' 10"  (1.778 m)   Wt 81.6 kg (180 lb)   LMP 05/04/2017   SpO2 100%   BMI 25.83 kg/m   Physical Exam  Constitutional: She is oriented to person, place, and time. She appears well-developed and well-nourished.  HENT:  Head: Normocephalic and atraumatic.  Right Ear: External ear normal.  Left Ear: External ear normal.  Nose: Nose normal.  Mouth/Throat: Oropharynx is clear and moist.  Eyes: Conjunctivae and EOM are normal. Pupils are equal, round, and reactive to light.  Neck: Normal range of motion. Neck supple.  Cardiovascular: Normal rate, regular rhythm, normal heart sounds and intact distal pulses.  Pulmonary/Chest: Effort normal and breath sounds normal.  Abdominal: Soft. Bowel sounds are normal. There is tenderness in the epigastric area.  Musculoskeletal:  Normal range of motion.  Neurological: She is alert and oriented to person, place, and time.  Skin: Skin is warm. Capillary refill takes less than 2 seconds.  Psychiatric: She has a normal mood and affect. Her behavior is normal. Judgment and thought content normal.  Nursing note and vitals reviewed.    ED Treatments / Results  Labs (all labs ordered are listed, but only abnormal results are displayed) Labs Reviewed  LIPASE, BLOOD - Abnormal; Notable for the following components:      Result Value   Lipase 85 (*)    All other components within normal limits  COMPREHENSIVE METABOLIC PANEL - Abnormal; Notable for the following components:   Sodium 132 (*)    Potassium 2.7 (*)    Chloride 96 (*)    Glucose, Bld 134 (*)    Creatinine, Ser 1.01 (*)    Total Protein 8.2 (*)    All other components within normal  limits  CBC - Abnormal; Notable for the following components:   WBC 13.1 (*)    RBC 5.14 (*)    Hemoglobin 15.6 (*)    All other components within normal limits  URINALYSIS, ROUTINE W REFLEX MICROSCOPIC - Abnormal; Notable for the following components:   Hgb urine dipstick LARGE (*)    Ketones, ur 20 (*)    Protein, ur 100 (*)    Bacteria, UA RARE (*)    Squamous Epithelial / LPF 0-5 (*)    All other components within normal limits  I-STAT BETA HCG BLOOD, ED ([facility], [facility], [facility] ONLY)    EKG  EKG Interpretation None       Radiology Ct Abdomen Pelvis W Contrast  Result Date: 05/10/2017 CLINICAL DATA:  Acute abdominal pain x6 days with nausea, vomiting and diarrhea. EXAM: CT ABDOMEN AND PELVIS WITH CONTRAST TECHNIQUE: Multidetector CT imaging of the abdomen and pelvis was performed using the standard protocol following bolus administration of intravenous contrast. CONTRAST:  ISOVUE-300 IOPAMIDOL (ISOVUE-300) INJECTION 61% COMPARISON:  None. FINDINGS: LOWER CHEST: Lung bases are clear. Included heart size is normal. No pericardial effusion. HEPATOBILIARY: Homogeneous attenuation of the liver without space-occupying mass. No biliary dilatation. Faint biliary sludge is seen within a physiologically distended gallbladder. No wall thickening or pericholecystic fluid is identified. PANCREAS: Normal SPLEEN: No splenomegaly or mass. ADRENALS/URINARY TRACT: Normal bilateral adrenal glands. Symmetric cortical enhancement of both kidneys. No nephrolithiasis nor obstructive uropathy. Physiologic distention of the urinary bladder without calculus or focal mural thickening. STOMACH/BOWEL: The stomach, small and large bowel are normal in course and caliber without inflammatory changes. The distal and terminal ileum are normal. The appendix is not visualized but no pericecal inflammatory change to suggest changes of an acute appendicitis. VASCULAR/LYMPHATIC: Aortoiliac vessels are normal in course and caliber.  No lymphadenopathy by CT size criteria. REPRODUCTIVE: Unremarkable uterus. A 4.9 x 3.6 x 3.8 cm partially septated mixed attenuating fatty mass of the left ovary compatible with a dermoid. OTHER: No intraperitoneal free fluid or free air. MUSCULOSKELETAL: Nonacute. IMPRESSION: 1. 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 2. No acute bowel obstruction or inflammation. The appendix is not visualized but no pericecal inflammatory change is noted. 3. Biliary sludge noted within a non thickened, physiologically distended gallbladder. Electronically Signed   By: Tollie Eth M.D.   On: 05/10/2017 15:51    Procedures Procedures (including critical care time)  Medications Ordered in ED Medications  ondansetron (ZOFRAN) injection 4 mg (4 mg Intravenous Given 05/10/17 1142)  sodium chloride 0.9 % bolus 1,000 mL (0 mLs Intravenous Stopped 05/10/17 1237)  gi cocktail (Maalox,Lidocaine,Donnatal) (30 mLs Oral Given 05/10/17 1236)  famotidine (PEPCID) IVPB 20 mg premix (0 mg Intravenous Stopped 05/10/17 1306)  morphine 4 MG/ML injection 4 mg (4 mg Intravenous Given 05/10/17 1237)  sodium chloride 0.9 % bolus 1,000 mL (0 mLs Intravenous Stopped 05/10/17 1441)  promethazine (PHENERGAN) injection 25 mg (25 mg Intravenous Given 05/10/17 1502)  iopamidol (ISOVUE-300) 61 % injection (100 mLs  Contrast Given 05/10/17 1532)     Initial Impression / Assessment and Plan / ED Course  I have reviewed the triage vital signs and the nursing notes.  Pertinent labs & imaging results that were available during my care of the patient were reviewed by me and considered in my medical decision making (see chart for details).  Pt is feeling better.  She is tolerating po.  She is stable for d/c.  Return if worse.     Final Clinical Impressions(s) / ED Diagnoses   Final diagnoses:  Dehydration  Hypokalemia  Nausea and vomiting, intractability of vomiting not specified, unspecified vomiting type    ED Discharge Orders         Ordered    promethazine (PHENERGAN) 25 MG tablet  Every 6 hours PRN     05/10/17 1608    potassium chloride SA (K-DUR,KLOR-CON) 20 MEQ tablet  Daily     05/10/17 1609       Jacalyn LefevreHaviland, Bertil Brickey, MD 05/10/17 1520    Jacalyn LefevreHaviland, Breven Guidroz, MD 05/10/17 1610

## 2017-05-10 NOTE — ED Notes (Signed)
Patient transported to CT 

## 2017-05-10 NOTE — ED Notes (Signed)
Lab called regarding UA still not in process. Stated they will run labs now

## 2017-05-10 NOTE — ED Triage Notes (Signed)
Pt presents for evaluation of ongoing nausea/vomiting since Friday. [facility]

## 2017-10-02 ENCOUNTER — Encounter (HOSPITAL_COMMUNITY): Payer: Self-pay | Admitting: *Deleted

## 2017-10-02 ENCOUNTER — Inpatient Hospital Stay (HOSPITAL_COMMUNITY)
Admission: AD | Admit: 2017-10-02 | Discharge: 2017-10-02 | Disposition: A | Discharge status: Home / Self Care | Payer: Medicaid Other | Source: Ambulatory Visit | Attending: Obstetrics & Gynecology | Admitting: Obstetrics & Gynecology

## 2017-10-02 DIAGNOSIS — Z87891 Personal history of nicotine dependence: Secondary | ICD-10-CM | POA: Insufficient documentation

## 2017-10-02 DIAGNOSIS — O219 Vomiting of pregnancy, unspecified: Secondary | ICD-10-CM | POA: Diagnosis present

## 2017-10-02 DIAGNOSIS — Z3A01 Less than 8 weeks gestation of pregnancy: Secondary | ICD-10-CM | POA: Insufficient documentation

## 2017-10-02 LAB — POCT PREGNANCY, URINE: PREG TEST UR: POSITIVE — AB

## 2017-10-02 LAB — URINALYSIS, ROUTINE W REFLEX MICROSCOPIC
BACTERIA UA: NONE SEEN
Bilirubin Urine: NEGATIVE
Glucose, UA: NEGATIVE mg/dL
Hgb urine dipstick: NEGATIVE
Ketones, ur: 20 mg/dL — AB
LEUKOCYTES UA: NEGATIVE
Nitrite: NEGATIVE
Protein, ur: 30 mg/dL — AB
SPECIFIC GRAVITY, URINE: 1.03 (ref 1.005–1.030)
pH: 6 (ref 5.0–8.0)

## 2017-10-02 MED ORDER — RANITIDINE HCL 150 MG PO TABS: 150.0000 mg | ORAL_TABLET | Freq: Two times a day (BID) | ORAL | 0 refills | Status: DC

## 2017-10-02 MED ORDER — M.V.I. ADULT IV INJ
Freq: Once | INTRAVENOUS | Status: AC
  Administered 2017-10-02: 11:00:00 via INTRAVENOUS
  Filled 2017-10-02: qty 1000

## 2017-10-02 MED ORDER — PROMETHAZINE HCL 25 MG/ML IJ SOLN
25.0000 mg | Freq: Once | INTRAVENOUS | Status: AC
  Administered 2017-10-02: 25 mg via INTRAVENOUS
  Filled 2017-10-02: qty 1

## 2017-10-02 NOTE — MAU Note (Signed)
Pt reports nausea and vomiting for the last 4 days, unable to keep anything down.

## 2017-10-02 NOTE — Discharge Instructions (Signed)
[facility]  ° ° °[facility]       Phone: [phone] ° °[facility] Phone: [phone] ° °[facility]  Phone: [phone] ° °[facility] [place] [facility]  Phone: [phone] ° °[facility]  Phone: [phone] ° °[facility]       Phone: [phone] ° °[facility]    Phone: [phone]  ° °[facility]    Phone: [phone] ° °[facility]    Phone: [phone] ° °[facility]    Phone: [phone] ° °[facility]    Phone: [phone] ° °[facility]       Phone: [phone]  ° °[facility]  Phone: [phone] ° °[facility]    Phone: [phone] ° °[facility]    Phone: [phone] ° °[facility]      Phone: [phone] ° °[facility]    Phone: [phone] ° °Safe Medications in Pregnancy  ° °Acne: °Benzoyl Peroxide °Salicylic Acid ° °Backache/Headache: °Tylenol: 2 regular strength every 4 hours OR °             2 Extra strength every 6 hours ° °Colds/Coughs/Allergies: °Benadryl (alcohol free) 25 mg every 6 hours as needed °Breath right strips °Claritin °Cepacol throat lozenges °Chloraseptic throat spray °Cold-Eeze- up to three times per day °Cough drops, alcohol free °Flonase (by prescription only) °Guaifenesin °Mucinex °Robitussin DM (plain only, alcohol free) °Saline nasal spray/drops °Sudafed (pseudoephedrine) & Actifed ** use only after [redacted] weeks gestation and if you do not have high blood pressure °Tylenol °Vicks Vaporub °Zinc lozenges °Zyrtec  ° °Constipation: °Colace °Ducolax suppositories °Fleet enema °Glycerin suppositories °Metamucil °Milk of  magnesia °Miralax °Senokot °Smooth move tea ° °Diarrhea: °Kaopectate °Imodium A-D ° °*NO pepto Bismol ° °Hemorrhoids: °Anusol °Anusol HC °Preparation H °Tucks ° °Indigestion: °Tums °Maalox °Mylanta °Zantac  °Pepcid ° °Insomnia: °Benadryl (alcohol free) 25mg every 6 hours as needed °Tylenol PM °Unisom, no Gelcaps ° °Leg Cramps: °Tums °MagGel ° °Nausea/Vomiting:  °Bonine °Dramamine °Emetrol °Ginger extract °Sea bands °Meclizine  °Nausea medication to take during pregnancy:  °Unisom (doxylamine succinate 25 mg tablets) Take one tablet daily at bedtime. If symptoms are not adequately controlled, the dose can be increased to a maximum recommended dose of two tablets daily (1/2 tablet in the morning, 1/2 tablet mid-afternoon and one at bedtime). °Vitamin B6 100mg tablets. Take one tablet twice a day (up to 200 mg per day). ° °Skin Rashes: °Aveeno products °Benadryl cream or 25mg every 6 hours as needed °Calamine Lotion °1% cortisone cream ° °Yeast infection: °Gyne-lotrimin 7 °Monistat 7 ° ° °**If taking multiple medications, please check labels to avoid duplicating the same active ingredients °**take medication as directed on the label °** Do not exceed 4000 mg of tylenol in 24 hours °**Do not take medications that contain aspirin or ibuprofen ° ° ° ° °

## 2017-10-02 NOTE — MAU Provider Note (Signed)
History     CSN: [id number]  Arrival date and time: 10/02/17 [id number]   First Provider Initiated Contact with Patient 10/02/17 [phone]      Chief Complaint  Patient presents with  . Emesis  . Nausea   HPI  Ms.  Brittany Bernard is a 27 y.o. year old [id number] female at 7548w2d weeks gestation who presents to MAU reporting N/V since Thursday 09/28/2017. She reports that Phenergan and Zofran that she has at home is working. She denies any blood in vomit. She reoprts she had N/V like this in previous pregnancies and "had to be admitted for IV nausea medications".    History reviewed. No pertinent past medical history.  Past Surgical History:  Procedure Laterality Date  . TONSILLECTOMY      No family history on file.  Social History   Tobacco Use  . Smoking status: Former Smoker    Packs/day: 0.50    Last attempt to quit: 02/14/2016    Years since quitting: 1.6  . Smokeless tobacco: Never Used  Substance Use Topics  . Alcohol use: Yes    Comment: social   . Drug use: Yes    Types: Marijuana    Comment: Daily    Allergies: No Known Allergies  Medications Prior to Admission  Medication Sig Dispense Refill Last Dose  . ondansetron (ZOFRAN ODT) 4 MG disintegrating tablet Take 1 tablet (4 mg total) by mouth every 8 (eight) hours as needed for nausea or vomiting. 20 tablet 0   . potassium chloride SA (K-DUR,KLOR-CON) 20 MEQ tablet Take 1 tablet (20 mEq total) by mouth daily. 10 tablet 0   . promethazine (PHENERGAN) 25 MG tablet Take 1 tablet (25 mg total) by mouth every 6 (six) hours as needed for nausea or vomiting. 30 tablet 0     Review of Systems  Constitutional: Positive for appetite change and fatigue.  HENT: Negative.   Eyes: Negative.   Respiratory: Negative.   Gastrointestinal: Positive for abdominal pain (from vomiting), nausea and vomiting ("unable to keep anything down").  Endocrine: Negative.   Genitourinary: Negative.   Musculoskeletal: Negative.   Skin: Negative.    Allergic/Immunologic: Negative.   Neurological: Positive for weakness.  Hematological: Negative.   Psychiatric/Behavioral: Negative.    Physical Exam   Blood pressure 125/87, pulse 100, temperature 97.9 F (36.6 C), temperature source Oral, resp. rate 16, height 5\' 10"  (1.778 m), weight 197 lb (89.4 kg), last menstrual period 08/12/2017, SpO2 100 %.  Physical Exam  Nursing note and vitals reviewed. Constitutional: She appears well-developed and well-nourished.  HENT:  Head: Normocephalic.  Eyes: Pupils are equal, round, and reactive to light.  Neck: Normal range of motion.  GI: Soft.    MAU Course  Procedures  MDM [facility] UPT IVFs: Phenergan 25 mg in LR 1000 ml @ bolus rate; then MVI in LR 1000 ml @ 500 ml/hr -- improved N/V PO Challenge -- able to keep fluids and crackers down   Results for orders placed or performed during the hospital encounter of 10/02/17 (from the past 48 hour(s))  Urinalysis, Routine w reflex microscopic     Status: Abnormal   Collection Time: 10/02/17  9:21 AM  Result Value Ref Range   Color, Urine YELLOW YELLOW   APPearance HAZY (A) CLEAR   Specific Gravity, Urine 1.030 1.005 - 1.030   pH 6.0 5.0 - 8.0   Glucose, UA NEGATIVE NEGATIVE mg/dL   Hgb urine dipstick NEGATIVE NEGATIVE   Bilirubin Urine NEGATIVE NEGATIVE  Ketones, ur 20 (A) NEGATIVE mg/dL   Protein, ur 30 (A) NEGATIVE mg/dL   Nitrite NEGATIVE NEGATIVE   Leukocytes, UA NEGATIVE NEGATIVE   RBC / HPF 6-10 0 - 5 RBC/hpf   WBC, UA 0-5 0 - 5 WBC/hpf   Bacteria, UA NONE SEEN NONE SEEN   Squamous Epithelial / LPF 0-5 0 - 5   Mucus PRESENT     Comment: Performed at [facility] [address] [place], [place] [address]  Pregnancy, urine POC     Status: Abnormal   Collection Time: 10/02/17  9:40 AM  Result Value Ref Range   Preg Test, Ur POSITIVE (A) NEGATIVE    Comment:        THE SENSITIVITY OF THIS METHODOLOGY IS >24 mIU/mL     Assessment and Plan  Nausea/vomiting in  pregnancy - Plan: Discharge patient - Information provided on morning sickness - Continue Phenergan and Zofran - Rx for Zantac 150 mg BID - Seek [facility] ASAP -- [facility] providers list given - Safe Meds in preg list given - Patient verbalized an understanding of the plan of care and agrees.   Raelyn Mora, MSN, CNM 10/02/2017, 9:55 AM

## 2018-07-20 IMAGING — CT CT ABD-PELV W/ CM
2 of 4 series · 16 of 46 positions shown, 18 images · IV contrast (iopamidol)
Comparison: None.

CLINICAL DATA: Acute abdominal pain x6 days with nausea, vomiting
and diarrhea.

EXAM:
CT ABDOMEN AND PELVIS WITH CONTRAST
TECHNIQUE: Multidetector CT imaging of the abdomen and pelvis was performed
using the standard protocol following bolus administration of
intravenous contrast.
CONTRAST:  100mL 946LCP-YBB IOPAMIDOL (946LCP-YBB) INJECTION 61%

[Series 3: abd/ pelvis 5.0 i30f 2 · axial · 0.90mm/px · z∈[+711,+1166]mm · 13 of 99 slices shown, 15 images]
[im 4/99  soft-tissue]
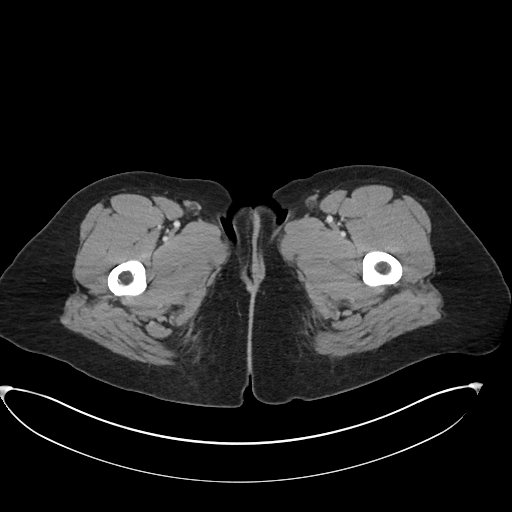
[im 4/99  bone]
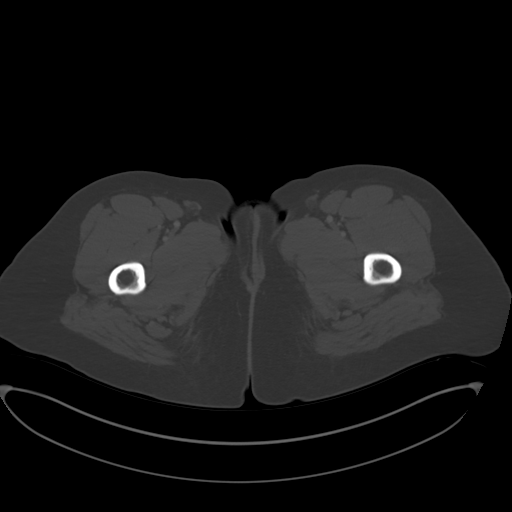
[im 12/99  soft-tissue]
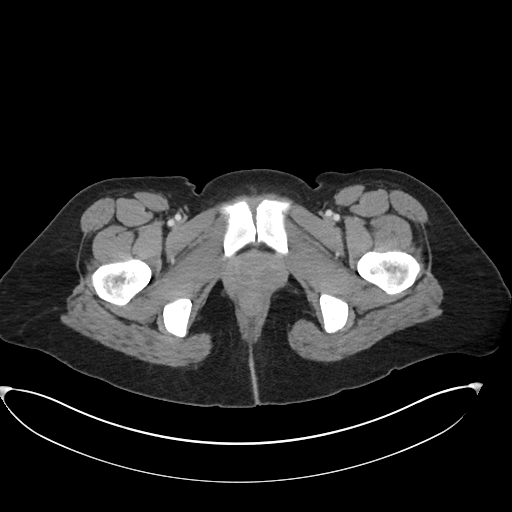
[im 20/99  soft-tissue]
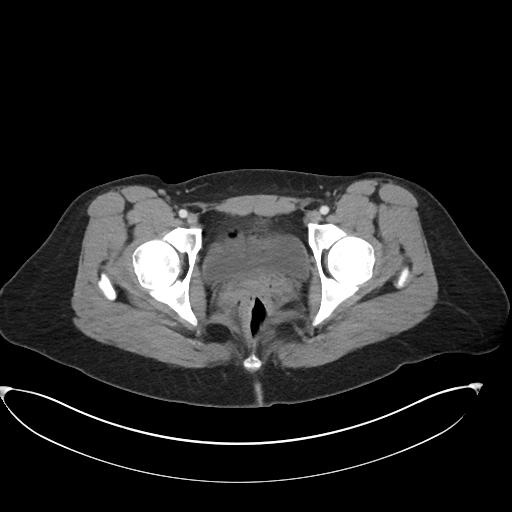
[im 28/99  soft-tissue]
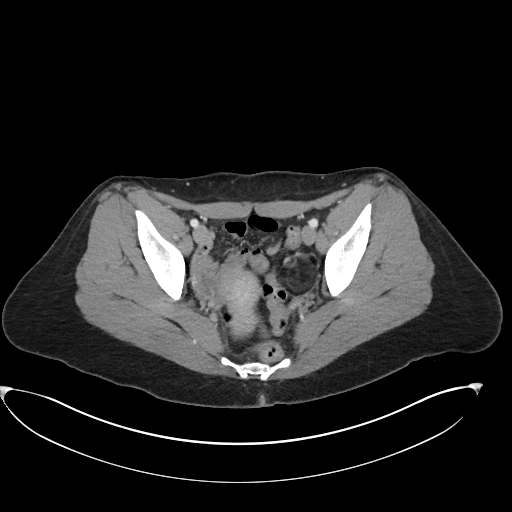
[im 36/99  soft-tissue]
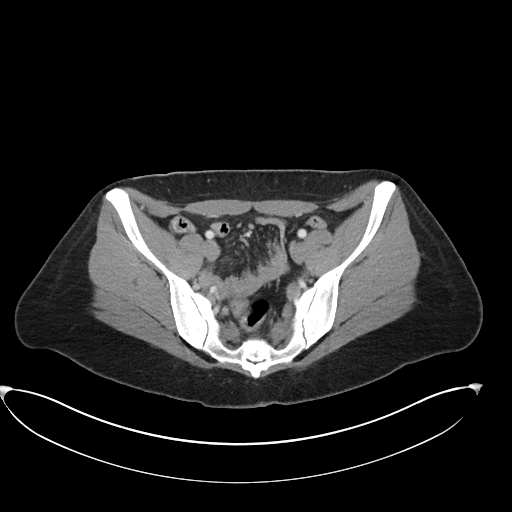
[im 44/99  soft-tissue]
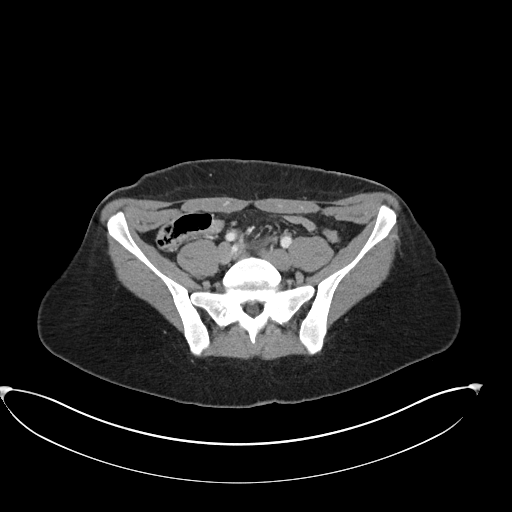
[im 51/99  soft-tissue]
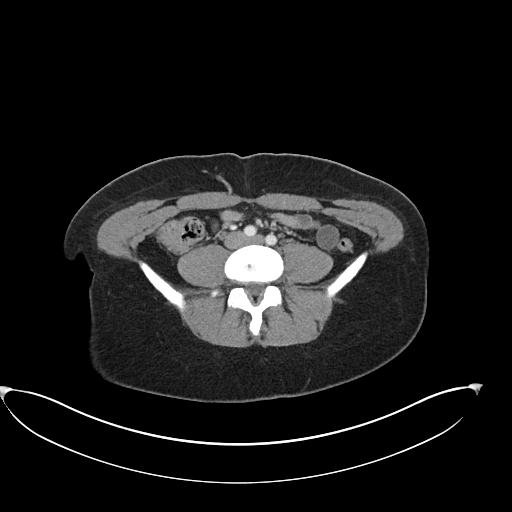
[im 55/99  soft-tissue]
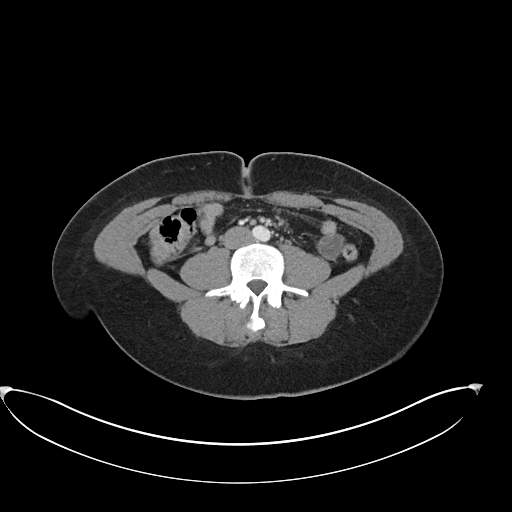
[im 63/99  soft-tissue]
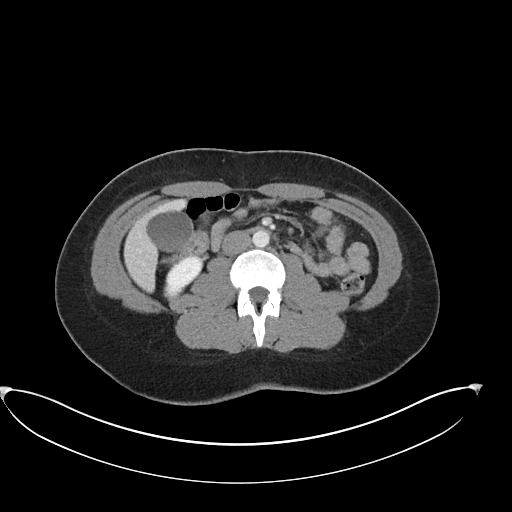
[im 63/99  bone]
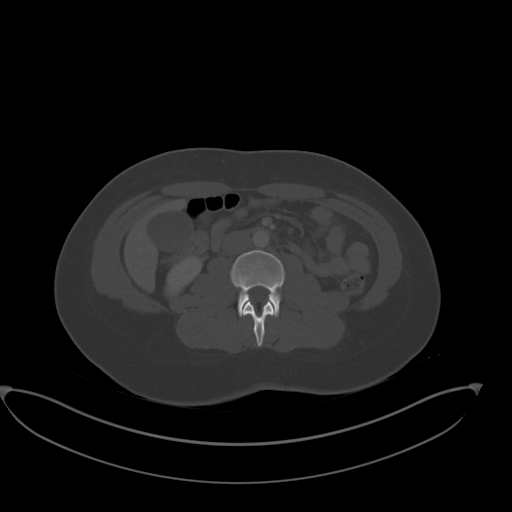
[im 71/99  soft-tissue]
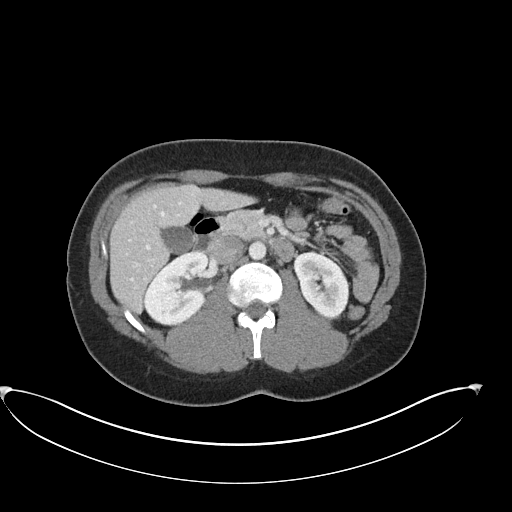
[im 79/99  soft-tissue]
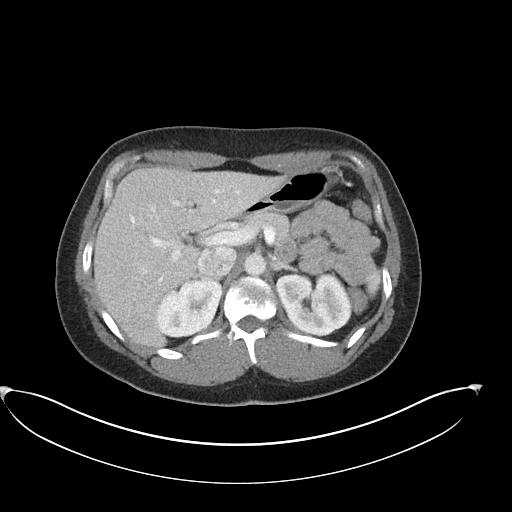
[im 87/99  soft-tissue]
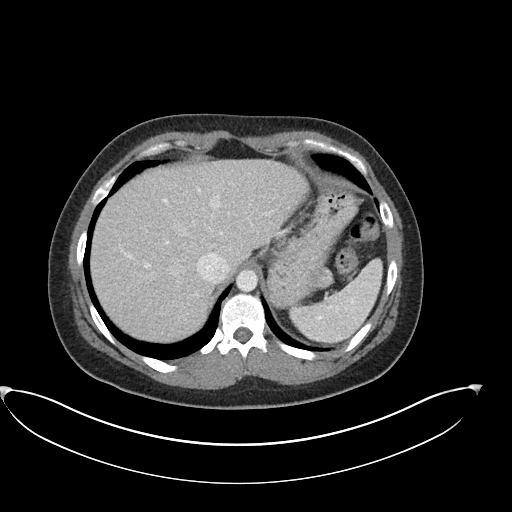
[im 95/99  soft-tissue]
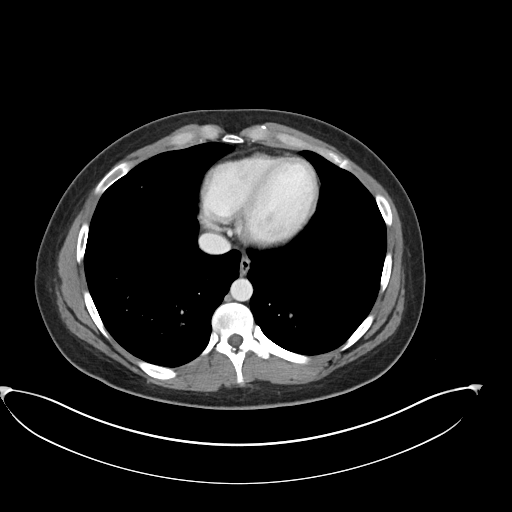

[Series 6: coronal soft tissue · coronal · 0.80mm/px · 3 of 92 slices shown]
[im 31/92  soft-tissue]
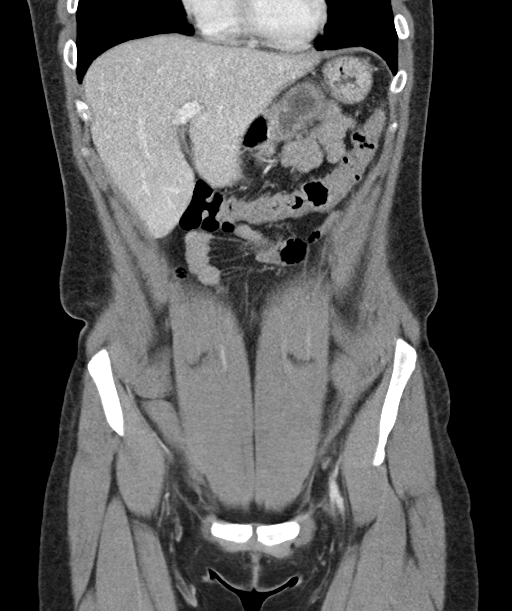
[im 41/92  soft-tissue]
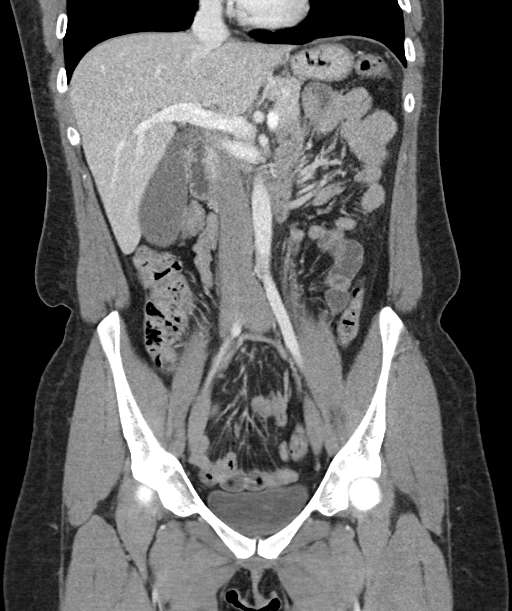
[im 51/92  soft-tissue]
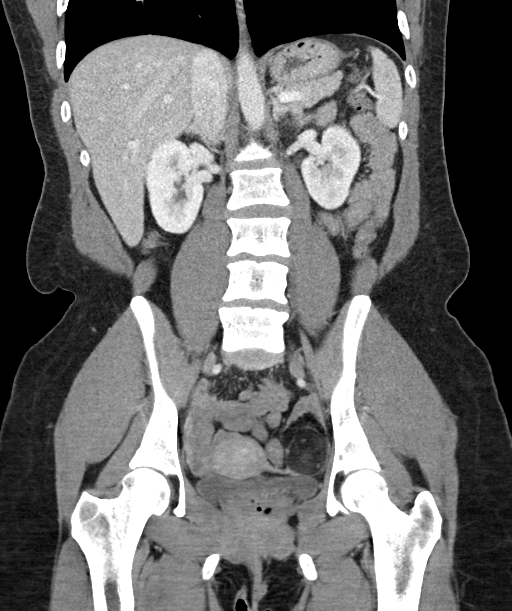

[16 of 46 positions shown; findings below may reference images not displayed]

FINDINGS: LOWER CHEST: Lung bases are clear. Included heart size is normal. No
pericardial effusion.

HEPATOBILIARY: Homogeneous attenuation of the liver without
space-occupying mass. No biliary dilatation. Faint biliary sludge is
seen within a physiologically distended gallbladder. No wall
thickening or pericholecystic fluid is identified.

PANCREAS: Normal

SPLEEN: No splenomegaly or mass.

ADRENALS/URINARY TRACT: Normal bilateral adrenal glands. Symmetric
cortical enhancement of both kidneys. No nephrolithiasis nor
obstructive uropathy. Physiologic distention of the urinary bladder
without calculus or focal mural thickening.

STOMACH/BOWEL: The stomach, small and large bowel are normal in
course and caliber without inflammatory changes. The distal and
terminal ileum are normal. The appendix is not visualized but no
pericecal inflammatory change to suggest changes of an acute
appendicitis.

VASCULAR/LYMPHATIC: Aortoiliac vessels are normal in course and
caliber. No lymphadenopathy by CT size criteria.

REPRODUCTIVE: Unremarkable uterus. A 4.9 x 3.6 x 3.8 cm partially
septated mixed attenuating fatty mass of the left ovary compatible
with a dermoid.

OTHER: No intraperitoneal free fluid or free air.

MUSCULOSKELETAL: Nonacute.
IMPRESSION: 1. 4.9 x 3.6 x 3.8 cm dermoid of the left ovary.
2. No acute bowel obstruction or inflammation. The appendix is not
visualized but no pericecal inflammatory change is noted.
3. Biliary sludge noted within a non thickened, physiologically
distended gallbladder.

## 2018-08-11 ENCOUNTER — Encounter (HOSPITAL_COMMUNITY): Payer: Self-pay

## 2019-04-19 ENCOUNTER — Encounter (HOSPITAL_COMMUNITY): Payer: Self-pay

## 2019-04-19 ENCOUNTER — Inpatient Hospital Stay (HOSPITAL_COMMUNITY)
Admission: EM | Admit: 2019-04-19 | Discharge: 2019-04-19 | Disposition: A | Discharge status: Home / Self Care | Payer: Medicaid Other | Attending: Obstetrics and Gynecology | Admitting: Obstetrics and Gynecology

## 2019-04-19 ENCOUNTER — Other Ambulatory Visit: Payer: Self-pay

## 2019-04-19 DIAGNOSIS — R42 Dizziness and giddiness: Secondary | ICD-10-CM | POA: Insufficient documentation

## 2019-04-19 DIAGNOSIS — Z87891 Personal history of nicotine dependence: Secondary | ICD-10-CM | POA: Insufficient documentation

## 2019-04-19 DIAGNOSIS — E876 Hypokalemia: Secondary | ICD-10-CM | POA: Insufficient documentation

## 2019-04-19 DIAGNOSIS — Z79899 Other long term (current) drug therapy: Secondary | ICD-10-CM | POA: Insufficient documentation

## 2019-04-19 DIAGNOSIS — O26891 Other specified pregnancy related conditions, first trimester: Secondary | ICD-10-CM | POA: Insufficient documentation

## 2019-04-19 DIAGNOSIS — R531 Weakness: Secondary | ICD-10-CM | POA: Insufficient documentation

## 2019-04-19 DIAGNOSIS — O210 Mild hyperemesis gravidarum: Secondary | ICD-10-CM

## 2019-04-19 DIAGNOSIS — Z3A01 Less than 8 weeks gestation of pregnancy: Secondary | ICD-10-CM

## 2019-04-19 HISTORY — DX: Mild hyperemesis gravidarum: O21.0

## 2019-04-19 LAB — CBC
HCT: 44.5 % (ref 36.0–46.0)
Hemoglobin: 14.6 g/dL (ref 12.0–15.0)
MCH: 29.6 pg (ref 26.0–34.0)
MCHC: 32.8 g/dL (ref 30.0–36.0)
MCV: 90.3 fL (ref 80.0–100.0)
Platelets: 378 10*3/uL (ref 150–400)
RBC: 4.93 MIL/uL (ref 3.87–5.11)
RDW: 13 % (ref 11.5–15.5)
WBC: 19.4 10*3/uL — ABNORMAL HIGH (ref 4.0–10.5)
nRBC: 0 % (ref 0.0–0.2)

## 2019-04-19 LAB — URINALYSIS, ROUTINE W REFLEX MICROSCOPIC
Bacteria, UA: NONE SEEN
Bilirubin Urine: NEGATIVE
Glucose, UA: NEGATIVE mg/dL
Hgb urine dipstick: NEGATIVE
Ketones, ur: 80 mg/dL — AB
Leukocytes,Ua: NEGATIVE
Nitrite: NEGATIVE
Protein, ur: 100 mg/dL — AB
Specific Gravity, Urine: 1.03 (ref 1.005–1.030)
pH: 6 (ref 5.0–8.0)

## 2019-04-19 LAB — COMPREHENSIVE METABOLIC PANEL
ALT: 18 U/L (ref 0–44)
AST: 19 U/L (ref 15–41)
Albumin: 4.4 g/dL (ref 3.5–5.0)
Alkaline Phosphatase: 45 U/L (ref 38–126)
Anion gap: 16 — ABNORMAL HIGH (ref 5–15)
BUN: 12 mg/dL (ref 6–20)
CO2: 19 mmol/L — ABNORMAL LOW (ref 22–32)
Calcium: 9.7 mg/dL (ref 8.9–10.3)
Chloride: 101 mmol/L (ref 98–111)
Creatinine, Ser: 0.96 mg/dL (ref 0.44–1.00)
GFR calc Af Amer: >60 mL/min (ref >60)
GFR calc non Af Amer: >60 mL/min (ref >60)
Glucose, Bld: 158 mg/dL — ABNORMAL HIGH (ref 70–99)
Potassium: 3.3 mmol/L — ABNORMAL LOW (ref 3.5–5.1)
Sodium: 136 mmol/L (ref 135–145)
Total Bilirubin: 0.9 mg/dL (ref 0.3–1.2)
Total Protein: 7.5 g/dL (ref 6.5–8.1)

## 2019-04-19 LAB — I-STAT BETA HCG BLOOD, ED (MC, WL, AP ONLY): I-stat hCG, quantitative: >2000 m[IU]/mL — ABNORMAL HIGH (ref <5)

## 2019-04-19 MED ORDER — ONDANSETRON HCL 4 MG/2ML IJ SOLN
4.0000 mg | Freq: Once | INTRAMUSCULAR | Status: AC
Start: 2019-04-19 — End: 2019-04-19
  Administered 2019-04-19: 12:00:00 4 mg via INTRAVENOUS
  Filled 2019-04-19: qty 2

## 2019-04-19 MED ORDER — ONDANSETRON HCL 4 MG/2ML IJ SOLN
4.0000 mg | Freq: Once | INTRAMUSCULAR | Status: AC
Start: 2019-04-19 — End: 2019-04-19
  Administered 2019-04-19: 15:00:00 4 mg via INTRAVENOUS
  Filled 2019-04-19: qty 2

## 2019-04-19 MED ORDER — SODIUM CHLORIDE 0.9 % IV BOLUS
1000.0000 mL | Freq: Once | INTRAVENOUS | Status: AC
  Administered 2019-04-19: 12:00:00 1000 mL via INTRAVENOUS

## 2019-04-19 MED ORDER — PANTOPRAZOLE SODIUM 40 MG IV SOLR
40.0000 mg | Freq: Once | INTRAVENOUS | Status: AC
  Administered 2019-04-19: 12:00:00 40 mg via INTRAVENOUS
  Filled 2019-04-19: qty 40

## 2019-04-19 MED ORDER — DOXYLAMINE SUCCINATE (SLEEP) 25 MG PO TABS: 25.0000 mg | ORAL_TABLET | Freq: Every evening | ORAL | 0 refills | Status: DC | PRN

## 2019-04-19 MED ORDER — ONDANSETRON 8 MG PO TBDP: 8.0000 mg | ORAL_TABLET | Freq: Three times a day (TID) | ORAL | 0 refills | Status: DC | PRN

## 2019-04-19 MED ORDER — POTASSIUM CHLORIDE 10 MEQ/100ML IV SOLN
10.0000 meq | Freq: Once | INTRAVENOUS | Status: AC
  Administered 2019-04-19: 13:00:00 10 meq via INTRAVENOUS
  Filled 2019-04-19: qty 100

## 2019-04-19 MED ORDER — PROMETHAZINE HCL 25 MG RE SUPP: 25.0000 mg | Freq: Four times a day (QID) | RECTAL | 1 refills | Status: DC | PRN

## 2019-04-19 MED ORDER — POTASSIUM CHLORIDE 10 MEQ/100ML IV SOLN
10.0000 meq | Freq: Once | INTRAVENOUS | Status: AC
  Administered 2019-04-19: 16:00:00 10 meq via INTRAVENOUS
  Filled 2019-04-19: qty 100

## 2019-04-19 MED ORDER — PROMETHAZINE HCL 25 MG/ML IJ SOLN
25.0000 mg | Freq: Once | INTRAMUSCULAR | Status: AC
  Administered 2019-04-19: 14:00:00 25 mg via INTRAVENOUS
  Filled 2019-04-19: qty 1

## 2019-04-19 MED ORDER — LACTATED RINGERS IV BOLUS
1000.0000 mL | Freq: Once | INTRAVENOUS | Status: AC
  Administered 2019-04-19: 17:00:00 1000 mL via INTRAVENOUS

## 2019-04-19 MED ORDER — DOXYLAMINE SUCCINATE (SLEEP) 25 MG PO TABS
25.0000 mg | ORAL_TABLET | Freq: Once | ORAL | Status: AC
  Administered 2019-04-19: 19:00:00 25 mg via ORAL
  Filled 2019-04-19: qty 1

## 2019-04-19 MED ORDER — SCOPOLAMINE 1 MG/3DAYS TD PT72: 1.0000 | MEDICATED_PATCH | TRANSDERMAL | 12 refills | Status: DC

## 2019-04-19 MED ORDER — METOCLOPRAMIDE HCL 5 MG/ML IJ SOLN
10.0000 mg | Freq: Once | INTRAMUSCULAR | Status: AC
  Administered 2019-04-19: 17:00:00 10 mg via INTRAVENOUS
  Filled 2019-04-19: qty 2

## 2019-04-19 MED ORDER — SCOPOLAMINE 1 MG/3DAYS TD PT72
1.0000 | MEDICATED_PATCH | TRANSDERMAL | Status: DC
  Administered 2019-04-19: 17:00:00 1.5 mg via TRANSDERMAL
  Filled 2019-04-19: qty 1

## 2019-04-19 NOTE — ED Provider Notes (Signed)
[facility] EMERGENCY DEPARTMENT Provider Note   CSN: [id number] Arrival date & time: 04/19/19  1138     History Chief Complaint  Patient presents with  . Nausea    Alvin Tooker is a 29 y.o. female with PMH significant for hyperemesis gravidarum as well as cannabinoid induced cyclic vomiting syndrome presents to the ED with a 4-day history of uncontrolled nausea and vomiting.  Patient reports that she has vomited greater than 60 times and has been unable to tolerate any food or liquid by mouth.  She had a positive urine pregnancy test 6 days ago.  She has only smoked marijuana once in the past week in an effort to help her sleep at night.  She also endorses weakness, lightheadedness, and a chest "burning" sensation.  She denies any fevers or chills, shortness of breath, cough, recent illness, abdominal pain, vaginal bleeding, vaginal discharge, pelvic pain, or other symptoms.    HPI     Past Medical History:  Diagnosis Date  . Hyperemesis arising during pregnancy     Patient Active Problem List   Diagnosis Date Noted  . Nausea/vomiting in pregnancy 10/02/2017  . Bone pain 02/18/2016  . 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 TONSILLECTOMY       OB History    Gravida  3   Para  1   Term  1   Preterm      AB      Living  1     SAB      TAB      Ectopic      Multiple      Live Births  1           No family history on file.  Social History   Tobacco Use  . Smoking status: Former Smoker    Packs/day: 0.50    Quit date: 02/14/2016    Years since quitting: 3.1  . Smokeless tobacco: Never Used  Substance Use Topics  . Alcohol use: Yes    Comment: social   . Drug use: Yes    Types: Marijuana    Comment: Daily    Home Medications Prior to Admission medications   Not on File    Allergies    Patient has no known allergies.  Review of Systems     Review of Systems  All other systems reviewed and are negative.   Physical Exam Updated Vital Signs BP 114/72   Pulse 72   Temp 98.7 F (37.1 C) (Oral)   Resp 18   Ht 5\' 10"  (1.778 m)   Wt 102.1 kg   SpO2 100%   BMI 32.28 kg/m   Physical Exam Vitals and nursing note reviewed. Exam conducted with a chaperone present.  Constitutional:      General: She is not in acute distress.    Comments: Uncomfortable.  HENT:     Head: Normocephalic and atraumatic.  Eyes:     General: No scleral icterus.    Conjunctiva/sclera: Conjunctivae normal.  Cardiovascular:     Rate and Rhythm: Normal rate and regular rhythm.     Pulses: Normal pulses.     Heart sounds: Normal heart sounds.  Pulmonary:     Effort: Pulmonary effort is normal. No respiratory distress.     Breath sounds: Normal breath sounds.  Abdominal:     General: Abdomen is flat. There is no distension.  Palpations: Abdomen is soft. There is no mass.     Tenderness: There is no abdominal tenderness. There is no guarding.  Musculoskeletal:     Cervical back: Normal range of motion and neck supple. No rigidity.  Skin:    General: Skin is dry.     Capillary Refill: Capillary refill takes less than 2 seconds.  Neurological:     Mental Status: She is alert and oriented to person, place, and time.     GCS: GCS eye subscore is 4. GCS verbal subscore is 5. GCS motor subscore is 6.  Psychiatric:        Mood and Affect: Mood normal.        Behavior: Behavior normal.        Thought Content: Thought content normal.      ED Results / Procedures / Treatments   Labs (all labs ordered are listed, but only abnormal results are displayed) Labs Reviewed  COMPREHENSIVE METABOLIC PANEL - Abnormal; Notable for the following components:      Result Value   Potassium 3.3 (*)    CO2 19 (*)    Glucose, Bld 158 (*)    Anion gap 16 (*)    All other components within normal limits  CBC - Abnormal; Notable for the following  components:   WBC 19.4 (*)    All other components within normal limits  URINALYSIS, ROUTINE W REFLEX MICROSCOPIC - Abnormal; Notable for the following components:   APPearance HAZY (*)    Ketones, ur 80 (*)    Protein, ur 100 (*)    All other components within normal limits  I-STAT BETA HCG BLOOD, ED ([facility], [facility], [facility] ONLY) - Abnormal; Notable for the following components:   I-stat hCG, quantitative >2,000.0 (*)    All other components within normal limits    EKG None  Radiology No results found.  Procedures Procedures (including critical care time)  Medications Ordered in ED Medications  potassium chloride 10 mEq in 100 mL IVPB (has no administration in time range)  sodium chloride 0.9 % bolus 1,000 mL (0 mLs Intravenous Stopped 04/19/19 1321)  ondansetron (ZOFRAN) injection 4 mg (4 mg Intravenous Given 04/19/19 1221)  pantoprazole (PROTONIX) injection 40 mg (40 mg Intravenous Given 04/19/19 1221)  potassium chloride 10 mEq in 100 mL IVPB (0 mEq Intravenous Stopped 04/19/19 1433)  promethazine (PHENERGAN) injection 25 mg (25 mg Intravenous Given 04/19/19 1344)  ondansetron (ZOFRAN) injection 4 mg (4 mg Intravenous Given 04/19/19 1518)    ED Course  I have reviewed the triage vital signs and the nursing notes.  Pertinent labs & imaging results that were available during my care of the patient were reviewed by me and considered in my medical decision making (see chart for details).  Clinical Course as of Apr 19 1531  Fri Apr 19, 2019  1527 Spoke with Dr. Crissie Reese, OB/GYN, who will accept patient over at [facility] for ongoing management of her uncontrolled nausea and vomiting and inability to tolerate food or liquid by mouth.     [GG]    Clinical Course User Index [GG] Lorelee New, PA-C   MDM Rules/Calculators/A&P                      While patient has an elevated leukocytosis to 19.4, it is common for elevated WBC count in context of pregnancy.  Her physical exam is  entirely benign.  She is mildly hypokalemic to 3.3, but replenished with 20  mEq potassium chloride IV.  1 L normal saline for rehydration.  Her physical exam is benign with no focal abdominal TTP.  She is denying any recent fevers, chills, or other symptoms at this time.  Spoke with Dr. Dione Plover, OB/GYN, who will accept patient over at [facility] for ongoing management of her uncontrolled nausea and vomiting and inability to tolerate food or liquid by mouth.  UA still pending at time of transfer.  Patient stable for transfer, RN to coordinate.    Final Clinical Impression(s) / ED Diagnoses Final diagnoses:  Hyperemesis gravidarum    Rx / DC Orders ED Discharge Orders    None       Corena Herter, PA-C 04/19/19 1533    Elnora Morrison, MD 04/20/19 [phone]

## 2019-04-19 NOTE — Discharge Instructions (Signed)
Hyperemesis Gravidarum Hyperemesis gravidarum is a severe form of nausea and vomiting that happens during pregnancy. Hyperemesis is worse than morning sickness. It may cause you to have nausea or vomiting all day for many days. It may keep you from eating and drinking enough food and liquids, which can lead to dehydration, malnutrition, and weight loss. Hyperemesis usually occurs during the first half (the first 20 weeks) of pregnancy. It often goes away once a woman is in her second half of pregnancy. However, sometimes hyperemesis continues through an entire pregnancy. What are the causes? The cause of this condition is not known. It may be related to changes in chemicals (hormones) in the body during pregnancy, such as the high level of pregnancy hormone (human chorionic gonadotropin) or the increase in the female sex hormone (estrogen). What are the signs or symptoms? Symptoms of this condition include:  Nausea that does not go away.  Vomiting that does not allow you to keep any food down.  Weight loss.  Body fluid loss (dehydration).  Having no desire to eat, or not liking food that you have previously enjoyed. How is this diagnosed? This condition may be diagnosed based on:  A physical exam.  Your medical history.  Your symptoms.  Blood tests.  Urine tests. How is this treated? This condition is managed by controlling symptoms. This may include:  Following an eating plan. This can help lessen nausea and vomiting.  Taking prescription medicines. An eating plan and medicines are often used together to help control symptoms. If medicines do not help relieve nausea and vomiting, you may need to receive fluids through an IV at the hospital. Follow these instructions at home: Eating and drinking   Avoid the following: ? Drinking fluids with meals. Try not to drink anything during the 30 minutes before and after your meals. ? Drinking more than 1 cup of fluid at a  time. ? Eating foods that trigger your symptoms. These may include spicy foods, coffee, high-fat foods, very sweet foods, and acidic foods. ? Skipping meals. Nausea can be more intense on an empty stomach. If you cannot tolerate food, do not force it. Try sucking on ice chips or other frozen items and make up for missed calories later. ? Lying down within 2 hours after eating. ? Being exposed to environmental triggers. These may include food smells, smoky rooms, closed spaces, rooms with strong smells, warm or humid places, overly loud and noisy rooms, and rooms with motion or flickering lights. Try eating meals in a well-ventilated area that is free of strong smells. ? Quick and sudden changes in your movement. ? Taking iron pills and multivitamins that contain iron. If you take prescription iron pills, do not stop taking them unless your health care provider approves. ? Preparing food. The smell of food can spoil your appetite or trigger nausea.  To help relieve your symptoms: ? Listen to your body. Everyone is different and has different preferences. Find what works best for you. ? Eat and drink slowly. ? Eat 5-6 small meals daily instead of 3 large meals. Eating small meals and snacks can help you avoid an empty stomach. ? In the morning, before getting out of bed, eat a couple of crackers to avoid moving around on an empty stomach. ? Try eating starchy foods as these are usually tolerated well. Examples include cereal, toast, bread, potatoes, pasta, rice, and pretzels. ? Include at least 1 serving of protein with your meals and snacks. Protein options include   lean meats, poultry, seafood, beans, nuts, nut butters, eggs, cheese, and yogurt. ? Try eating a protein-rich snack before bed. Examples of a protein-rick snack include cheese and crackers or a peanut butter sandwich made with 1 slice of whole-wheat bread and 1 tsp (5 g) of peanut butter. ? Eat or suck on things that have ginger in them.  It may help relieve nausea. Add  tsp ground ginger to hot tea or choose ginger tea. ? Try drinking 100% fruit juice or an electrolyte drink. An electrolyte drink contains sodium, potassium, and chloride. ? Drink fluids that are cold, clear, and carbonated or sour. Examples include lemonade, ginger ale, lemon-lime soda, ice water, and sparkling water. ? Brush your teeth or use a mouth rinse after meals. ? Talk with your health care provider about starting a supplement of vitamin B6. General instructions  Take over-the-counter and prescription medicines only as told by your health care provider.  Follow instructions from your health care provider about eating or drinking restrictions.  Continue to take your prenatal vitamins as told by your health care provider. If you are having trouble taking your prenatal vitamins, talk with your health care provider about different options.  Keep all follow-up and pre-birth (prenatal) visits as told by your health care provider. This is important. Contact a health care provider if:  You have pain in your abdomen.  You have a severe headache.  You have vision problems.  You are losing weight.  You feel weak or dizzy. Get help right away if:  You cannot drink fluids without vomiting.  You vomit blood.  You have constant nausea and vomiting.  You are very weak.  You faint.  You have a fever and your symptoms suddenly get worse. Summary  Hyperemesis gravidarum is a severe form of nausea and vomiting that happens during pregnancy.  Making some changes to your eating habits may help relieve nausea and vomiting.  This condition may be managed with medicine.  If medicines do not help relieve nausea and vomiting, you may need to receive fluids through an IV at the hospital. This information is not intended to replace advice given to you by your health care provider. Make sure you discuss any questions you have with your health care  provider. Document Revised: 03/06/2017 Document Reviewed: 10/14/2015 Elsevier Patient Education  2020 [employer] Inc[employer]  

## 2019-04-19 NOTE — MAU Provider Note (Signed)
History     [id number]  Arrival date and time: 04/19/19 1138    Chief Complaint  Patient presents with  . Nausea     HPI Brittany Bernard is a 29 y.o. at [redacted]w[redacted]d by patient report, who presents for n/v.   Patient transferred from ED after workup for n/v and found to have +HCG>2k Given zofran 8mg , pantoprazole 40mg , phenergan 25mg , and 1L NS CMP w mild hypokalemia so also given of IV potassium  Reports non-stop n/v for past four days Last full meal on Sunday Also having trouble keeping liquids down during that time Found out she was pregnant 04/13/2019 Periods prior to this were regular, 03/15/2019, EGA [redacted]w[redacted]d Had some problems with last pregnancy but did not cont with pregnancy Symptoms mild w first pregnancy Treatment in ED only mildly alleviated symptoms but did not resolve them  Endorses cramping with straining from emesis but otherwise denies abdominal pain No vaginal discharge No burning or pain urination No fevers No diarrhea  Vaginal bleeding: No LOF: No Fetal Movement: No Contractions: No     OB History    Gravida  3   Para  1   Term  1   Preterm      AB      Living  1     SAB      TAB      Ectopic      Multiple      Live Births  1           Past Medical History:  Diagnosis Date  . Hyperemesis arising during pregnancy     Past Surgical History:  Procedure Laterality Date  . TONSILLECTOMY      No family history on file.  Social History   Socioeconomic History  . Marital status: Single    Spouse name: Not on file  . Number of children: Not on file  . Years of education: Not on file  . Highest education level: Not on file  Occupational History  . Not on file  Tobacco Use  . Smoking status: Former Smoker    Packs/day: 0.50    Quit date: 02/14/2016    Years since quitting: 3.1  . Smokeless tobacco: Never Used  Substance and Sexual Activity  . Alcohol use: Yes    Comment: social   . Drug use: Yes    Types:  Marijuana    Comment: Daily  . Sexual activity: Not on file  Other Topics Concern  . Not on file  Social History Narrative  . Not on file   Social Determinants of Health   Financial Resource Strain:   . Difficulty of Paying Living Expenses: Not on file  Food Insecurity:   . Worried About 03/17/2019 in the Last Year: Not on file  . Ran Out of Food in the Last Year: Not on file  Transportation Needs:   . Lack of Transportation (Medical): Not on file  . Lack of Transportation (Non-Medical): Not on file  Physical Activity:   . Days of Exercise per Week: Not on file  . Minutes of Exercise per Session: Not on file  Stress:   . Feeling of Stress : Not on file  Social Connections:   . Frequency of Communication with Friends and Family: Not on file  . Frequency of Social Gatherings with Friends and Family: Not on file  . Attends Religious Services: Not on file  . Active Member of Clubs or Organizations: Not on  file  . Attends Banker Meetings: Not on file  . Marital Status: Not on file  Intimate Partner Violence:   . Fear of Current or Ex-Partner: Not on file  . Emotionally Abused: Not on file  . Physically Abused: Not on file  . Sexually Abused: Not on file    No Known Allergies  No current facility-administered medications on file prior to encounter.   No current outpatient medications on file prior to encounter.     ROS Complete ROS completed and otherwise negative except as noted in HPI  Physical Exam   BP 111/69   Pulse 85   Temp 98.7 F (37.1 C) (Oral)   Resp 18   Ht 5\' 10"  (1.778 m)   Wt 102.1 kg   SpO2 100%   BMI 32.28 kg/m   Physical Exam  Vitals reviewed. Constitutional: She appears well-developed and well-nourished. No distress.  Eyes: No scleral icterus.  Respiratory: Effort normal. No respiratory distress.  GI: Soft. She exhibits no distension. There is no abdominal tenderness. There is no rebound and no guarding.   Musculoskeletal:        General: No edema.  Neurological: She is alert. Coordination normal.  Skin: Skin is warm and dry. She is not diaphoretic.  Psychiatric: She has a normal mood and affect.    Bedside Ultrasound Not done   FHT Not done  Labs Results for orders placed or performed during the hospital encounter of 04/19/19 (from the past 24 hour(s))  Comprehensive metabolic panel     Status: Abnormal   Collection Time: 04/19/19 11:48 AM  Result Value Ref Range   Sodium 136 135 - 145 mmol/L   Potassium 3.3 (L) 3.5 - 5.1 mmol/L   Chloride 101 98 - 111 mmol/L   CO2 19 (L) 22 - 32 mmol/L   Glucose, Bld 158 (H) 70 - 99 mg/dL   BUN 12 6 - 20 mg/dL   Creatinine, Ser 04/21/19 0.44 - 1.00 mg/dL   Calcium 9.7 8.9 - 6.06 mg/dL   Total Protein 7.5 6.5 - 8.1 g/dL   Albumin 4.4 3.5 - 5.0 g/dL   AST 19 15 - 41 U/L   ALT 18 0 - 44 U/L   Alkaline Phosphatase 45 38 - 126 U/L   Total Bilirubin 0.9 0.3 - 1.2 mg/dL   GFR calc non Af Amer >60 >60 mL/min   GFR calc Af Amer >60 >60 mL/min   Anion gap 16 (H) 5 - 15  CBC     Status: Abnormal   Collection Time: 04/19/19 11:48 AM  Result Value Ref Range   WBC 19.4 (H) 4.0 - 10.5 K/uL   RBC 4.93 3.87 - 5.11 MIL/uL   Hemoglobin 14.6 12.0 - 15.0 g/dL   HCT 04/21/19 60.1 - 09.3 %   MCV 90.3 80.0 - 100.0 fL   MCH 29.6 26.0 - 34.0 pg   MCHC 32.8 30.0 - 36.0 g/dL   RDW 23.5 57.3 - 22.0 %   Platelets 378 150 - 400 K/uL   nRBC 0.0 0.0 - 0.2 %  I-Stat beta hCG blood, ED     Status: Abnormal   Collection Time: 04/19/19 12:00 PM  Result Value Ref Range   I-stat hCG, quantitative >2,000.0 (H) <5 mIU/mL   Comment 3          Urinalysis, Routine w reflex microscopic     Status: Abnormal   Collection Time: 04/19/19  3:00 PM  Result Value Ref Range  Color, Urine YELLOW YELLOW   APPearance HAZY (A) CLEAR   Specific Gravity, Urine 1.030 1.005 - 1.030   pH 6.0 5.0 - 8.0   Glucose, UA NEGATIVE NEGATIVE mg/dL   Hgb urine dipstick NEGATIVE NEGATIVE    Bilirubin Urine NEGATIVE NEGATIVE   Ketones, ur 80 (A) NEGATIVE mg/dL   Protein, ur 100 (A) NEGATIVE mg/dL   Nitrite NEGATIVE NEGATIVE   Leukocytes,Ua NEGATIVE NEGATIVE   RBC / HPF 0-5 0 - 5 RBC/hpf   WBC, UA 0-5 0 - 5 WBC/hpf   Bacteria, UA NONE SEEN NONE SEEN   Squamous Epithelial / LPF 0-5 0 - 5   Mucus PRESENT     Imaging Not obtained  MAU Course  Procedures Meds ordered this encounter  Medications  . sodium chloride 0.9 % bolus 1,000 mL  . ondansetron (ZOFRAN) injection 4 mg  . pantoprazole (PROTONIX) injection 40 mg  . potassium chloride 10 mEq in 100 mL IVPB  . promethazine (PHENERGAN) injection 25 mg  . ondansetron (ZOFRAN) injection 4 mg  . potassium chloride 10 mEq in 100 mL IVPB  . scopolamine (TRANSDERM-SCOP) 1 MG/3DAYS 1.5 mg  . metoCLOPramide (REGLAN) injection 10 mg  . lactated ringers bolus 1,000 mL  . doxylamine (Sleep) (UNISOM) tablet 25 mg    MDM Moderate  Assessment and Plan  #Hyperemesis gravidarum S/p multiple different medications, not feeling 100% but able to tolerate PO liquids and small amounts of crackers. Discussed admission vs d/c to home, engaged in shared decision making and she will be d/c with meds to home. Plan to discharge w scopolamine patch, unisom QHS, and PRN zofran ODT and PR phenergan. Reviewed risks of zofran in 1st trimester and she understands and accepts risks. Has not yet established care, given list of practices. D/c to home in stable condition.   Clarnce Flock

## 2019-04-19 NOTE — ED Notes (Signed)
Bedside report given to Ginger, RN at MAU

## 2019-04-19 NOTE — ED Triage Notes (Signed)
Pt arrived [facility] for c/o N/Vx5 days. Home pregnancy test was positive. Pt states has hx of hyperemesis w/pregnancies. Pt is A&Ox4. VS WNL. Skin color WNL

## 2019-04-19 NOTE — MAU Note (Signed)
.   Brittany Bernard is a 29 y.o. at [redacted]w[redacted]d here in MAU from the ED for N/V.Pt has IV potassium infusing on IV pump LMP: 03/15/19 Onset of complaint: couple of days Pain score:0 Vitals:   04/19/19 1701 04/19/19 1723  BP: (!) 113/58   Pulse: 72   Resp:  16  Temp:  98.2 F (36.8 C)  SpO2:  100%     FHT: Lab orders placed from triage:

## 2019-04-19 NOTE — ED Notes (Signed)
Nadean Corwin (mother) [phone]  Donney Dice (husband) [phone]

## 2019-06-26 ENCOUNTER — Ambulatory Visit: Payer: Medicaid Other | Attending: Internal Medicine

## 2019-06-26 DIAGNOSIS — Z20822 Contact with and (suspected) exposure to covid-19: Secondary | ICD-10-CM

## 2019-06-27 LAB — SARS-COV-2, NAA 2 DAY TAT

## 2019-06-27 LAB — NOVEL CORONAVIRUS, NAA: SARS-CoV-2, NAA: NOT DETECTED

## 2019-10-17 ENCOUNTER — Other Ambulatory Visit: Payer: Self-pay

## 2019-10-17 ENCOUNTER — Other Ambulatory Visit: Payer: Medicaid Other

## 2019-10-17 DIAGNOSIS — Z20822 Contact with and (suspected) exposure to covid-19: Secondary | ICD-10-CM

## 2019-10-18 LAB — SARS-COV-2, NAA 2 DAY TAT

## 2019-10-18 LAB — NOVEL CORONAVIRUS, NAA: SARS-CoV-2, NAA: DETECTED — AB

## 2019-11-27 ENCOUNTER — Other Ambulatory Visit: Payer: Medicaid Other

## 2021-04-14 ENCOUNTER — Encounter (HOSPITAL_COMMUNITY): Payer: Self-pay | Admitting: *Deleted

## 2021-04-14 ENCOUNTER — Other Ambulatory Visit: Payer: Self-pay

## 2021-04-14 ENCOUNTER — Inpatient Hospital Stay (HOSPITAL_COMMUNITY)
Admission: AD | Admit: 2021-04-14 | Discharge: 2021-04-14 | Disposition: A | Discharge status: Home / Self Care | Payer: 59 | Attending: Obstetrics & Gynecology | Admitting: Obstetrics & Gynecology

## 2021-04-14 ENCOUNTER — Inpatient Hospital Stay (HOSPITAL_COMMUNITY): Payer: 59

## 2021-04-14 DIAGNOSIS — F17210 Nicotine dependence, cigarettes, uncomplicated: Secondary | ICD-10-CM | POA: Insufficient documentation

## 2021-04-14 DIAGNOSIS — Z3A01 Less than 8 weeks gestation of pregnancy: Secondary | ICD-10-CM | POA: Insufficient documentation

## 2021-04-14 DIAGNOSIS — O99331 Smoking (tobacco) complicating pregnancy, first trimester: Secondary | ICD-10-CM | POA: Diagnosis not present

## 2021-04-14 DIAGNOSIS — O209 Hemorrhage in early pregnancy, unspecified: Secondary | ICD-10-CM | POA: Insufficient documentation

## 2021-04-14 DIAGNOSIS — Z3491 Encounter for supervision of normal pregnancy, unspecified, first trimester: Secondary | ICD-10-CM | POA: Diagnosis not present

## 2021-04-14 HISTORY — DX: Other specified health status: Z78.9

## 2021-04-14 LAB — CBC
HCT: 38.5 % (ref 36.0–46.0)
Hemoglobin: 12.4 g/dL (ref 12.0–15.0)
MCH: 29.3 pg (ref 26.0–34.0)
MCHC: 32.2 g/dL (ref 30.0–36.0)
MCV: 91 fL (ref 80.0–100.0)
Platelets: 322 10*3/uL (ref 150–400)
RBC: 4.23 MIL/uL (ref 3.87–5.11)
RDW: 13.2 % (ref 11.5–15.5)
WBC: 10.6 10*3/uL — ABNORMAL HIGH (ref 4.0–10.5)
nRBC: 0 % (ref 0.0–0.2)

## 2021-04-14 LAB — URINALYSIS, ROUTINE W REFLEX MICROSCOPIC
Bilirubin Urine: NEGATIVE
Glucose, UA: NEGATIVE mg/dL
Ketones, ur: NEGATIVE mg/dL
Leukocytes,Ua: NEGATIVE
Nitrite: NEGATIVE
Protein, ur: NEGATIVE mg/dL
Specific Gravity, Urine: 1.02 (ref 1.005–1.030)
pH: 5 (ref 5.0–8.0)

## 2021-04-14 LAB — HCG, QUANTITATIVE, PREGNANCY: hCG, Beta Chain, Quant, S: 254 m[IU]/mL — ABNORMAL HIGH (ref <5)

## 2021-04-14 LAB — POCT PREGNANCY, URINE: Preg Test, Ur: POSITIVE — AB

## 2021-04-14 NOTE — MAU Note (Signed)
Presents with c/o cramping and VB tht began this morning.  Reports wearing pant liner, hasn't saturated though.  LMP 03/09/2021.

## 2021-04-14 NOTE — Discharge Instructions (Signed)

## 2021-04-14 NOTE — MAU Note (Signed)
Pt declined vaginal swabs. Pt reports she needs to pick her child up.   Provider made aware.

## 2021-04-14 NOTE — MAU Provider Note (Signed)
History     CSN: [id number]  Arrival date and time: 04/14/21 1149   Event Date/Time   First Provider Initiated Contact with Patient 04/14/21 1320      Chief Complaint  Patient presents with   Abdominal Pain   Vaginal Bleeding   HPI Brittany Bernard is a 31 y.o. G3P1011 at [redacted]w[redacted]d who presents with bleeding and abdominal pain. She states she had intercourse yesterday and after she noticed some bleeding when she wipes. She reports the bleeding got heavier today. She also reports lower abdominal cramping that she rates a 4/10 and has not taken anything for. She reports a positive UPT last week and has not been seen anywhere this pregnancy.   OB History     Gravida  3   Para  1   Term  1   Preterm      AB  1   Living  1      SAB      IAB  1   Ectopic      Multiple      Live Births  1           Past Medical History:  Diagnosis Date   Hyperemesis arising during pregnancy    Medical history non-contributory     Past Surgical History:  Procedure Laterality Date   TONSILLECTOMY      Family History  Problem Relation Age of Onset   Healthy Mother    Hypertension Father     Social History   Tobacco Use   Smoking status: Some Days    Packs/day: 0.50    Types: Cigarettes   Smokeless tobacco: Never  Vaping Use   Vaping Use: Never used  Substance Use Topics   Alcohol use: Not Currently   Drug use: Yes    Types: Marijuana    Comment: 04/14/2021    Allergies: No Known Allergies  Medications Prior to Admission  Medication Sig Dispense Refill Last Dose   doxylamine, Sleep, (UNISOM) 25 MG tablet Take 1 tablet (25 mg total) by mouth at bedtime as needed. 30 tablet 0    ondansetron (ZOFRAN ODT) 8 MG disintegrating tablet Take 1 tablet (8 mg total) by mouth every 8 (eight) hours as needed for nausea or vomiting. 20 tablet 0    promethazine (PHENERGAN) 25 MG suppository Place 1 suppository (25 mg total) rectally every 6 (six) hours as needed for nausea. 12  suppository 1    scopolamine (TRANSDERM-SCOP) 1 MG/3DAYS Place 1 patch (1.5 mg total) onto the skin every 3 (three) days. 10 patch 12     Review of Systems  Constitutional: Negative.  Negative for fatigue and fever.  HENT: Negative.    Respiratory: Negative.  Negative for shortness of breath.   Cardiovascular: Negative.  Negative for chest pain.  Gastrointestinal:  Positive for abdominal pain. Negative for constipation, diarrhea, nausea and vomiting.  Genitourinary:  Positive for vaginal bleeding. Negative for dysuria and vaginal discharge.  Neurological: Negative.  Negative for dizziness and headaches.  Physical Exam   Blood pressure 109/66, pulse 94, temperature 98.1 F (36.7 C), temperature source Oral, resp. rate 20, height 5\' 10"  (1.778 m), weight 118.4 kg, last menstrual period 03/09/2021, SpO2 99 %, unknown if currently breastfeeding.  Physical Exam Vitals and nursing note reviewed.  Constitutional:      General: She is not in acute distress.    Appearance: She is well-developed.  HENT:     Head: Normocephalic.  Eyes:     Pupils:  Pupils are equal, round, and reactive to light.  Cardiovascular:     Rate and Rhythm: Normal rate and regular rhythm.     Heart sounds: Normal heart sounds.  Pulmonary:     Effort: Pulmonary effort is normal. No respiratory distress.     Breath sounds: Normal breath sounds.  Abdominal:     General: Bowel sounds are normal. There is no distension.     Palpations: Abdomen is soft.     Tenderness: There is no abdominal tenderness.  Skin:    General: Skin is warm and dry.  Neurological:     Mental Status: She is alert and oriented to person, place, and time.  Psychiatric:        Mood and Affect: Mood normal.        Behavior: Behavior normal.        Thought Content: Thought content normal.        Judgment: Judgment normal.    MAU Course  Procedures Results for orders placed or performed during the hospital encounter of 04/14/21 (from the  past 24 hour(s))  Pregnancy, urine POC     Status: Abnormal   Collection Time: 04/14/21 12:05 PM  Result Value Ref Range   Preg Test, Ur POSITIVE (A) NEGATIVE  Urinalysis, Routine w reflex microscopic Urine, Clean Catch     Status: Abnormal   Collection Time: 04/14/21 12:23 PM  Result Value Ref Range   Color, Urine YELLOW YELLOW   APPearance HAZY (A) CLEAR   Specific Gravity, Urine 1.020 1.005 - 1.030   pH 5.0 5.0 - 8.0   Glucose, UA NEGATIVE NEGATIVE mg/dL   Hgb urine dipstick MODERATE (A) NEGATIVE   Bilirubin Urine NEGATIVE NEGATIVE   Ketones, ur NEGATIVE NEGATIVE mg/dL   Protein, ur NEGATIVE NEGATIVE mg/dL   Nitrite NEGATIVE NEGATIVE   Leukocytes,Ua NEGATIVE NEGATIVE   RBC / HPF 0-5 0 - 5 RBC/hpf   WBC, UA 0-5 0 - 5 WBC/hpf   Bacteria, UA RARE (A) NONE SEEN   Squamous Epithelial / LPF 6-10 0 - 5   Mucus PRESENT    Sperm, UA PRESENT   CBC     Status: Abnormal   Collection Time: 04/14/21  1:28 PM  Result Value Ref Range   WBC 10.6 (H) 4.0 - 10.5 K/uL   RBC 4.23 3.87 - 5.11 MIL/uL   Hemoglobin 12.4 12.0 - 15.0 g/dL   HCT 02.2 33.6 - 12.2 %   MCV 91.0 80.0 - 100.0 fL   MCH 29.3 26.0 - 34.0 pg   MCHC 32.2 30.0 - 36.0 g/dL   RDW 44.9 75.3 - 00.5 %   Platelets 322 150 - 400 K/uL   nRBC 0.0 0.0 - 0.2 %  hCG, quantitative, pregnancy     Status: Abnormal   Collection Time: 04/14/21  1:28 PM  Result Value Ref Range   hCG, Beta Chain, Quant, S 254 (H) <5 mIU/mL    US OB LESS THAN 14 WEEKS WITH OB TRANSVAGINAL  Result Date: 04/14/2021 CLINICAL DATA:  Cramping and vaginal bleeding. Estimated gestational age of [redacted] weeks, 1 day by LMP. EXAM: OBSTETRIC <14 WK [place] AND TRANSVAGINAL OB US TECHNIQUE: Both transabdominal and transvaginal ultrasound examinations were performed for complete evaluation of the gestation as well as the maternal uterus, adnexal regions, and pelvic cul-de-sac. Transvaginal technique was performed to assess early pregnancy. COMPARISON:  CT abdomen pelvis dated May 10, 2017. FINDINGS: Intrauterine gestational sac: Single. Yolk sac:  Faintly visualized. Embryo:  Not  Visualized. MSD: 3.4 mm   5 w   0 d Subchorionic hemorrhage:  None visualized. Maternal uterus/adnexae: The right ovary is unremarkable. 3.9 x 3.7 x 6.0 cm round hyperechoic mass in the left adnexa appears mildly increased in size compared to prior CT when it measured 3.8 x 3.6 x 4.9 cm. No free fluid in the pelvis. IMPRESSION: 1. Probable early intrauterine gestational sac and yolk sac, but no fetal pole or cardiac activity yet visualized. Recommend follow-up quantitative B-HCG levels and follow-up US in 14 days to assess viability. This recommendation follows SRU consensus guidelines: Diagnostic Criteria for Nonviable Pregnancy Early in the First Trimester. Malva Limes Med 2013; 675:9163-84. 2. 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 Electronically Signed   By: Obie Dredge M.D.   On: 04/14/2021 14:29     MDM UA, UPT CBC, HCG ABO/Rh-  A Pos Wet prep and gc/chlamydia- declined US OB Comp Less 14 weeks with Transvaginal   Assessment and Plan   1. Normal intrauterine pregnancy on prenatal ultrasound in first trimester   2. Vaginal bleeding affecting early pregnancy   3. [redacted] weeks gestation of pregnancy    -Discharge home in stable condition -First trimester precautions discussed -Patient advised to follow-up with [facility] in 2 weeks for repeat ultrasound, order placed -Patient may return to MAU as needed or if her condition were to change or worsen   Rolm Bookbinder CNM 04/14/2021, 1:20 PM

## 2021-04-28 ENCOUNTER — Ambulatory Visit (INDEPENDENT_AMBULATORY_CARE_PROVIDER_SITE_OTHER): Payer: 59 | Admitting: Family Medicine

## 2021-04-28 ENCOUNTER — Encounter: Payer: Self-pay | Admitting: Family Medicine

## 2021-04-28 ENCOUNTER — Other Ambulatory Visit: Payer: Self-pay

## 2021-04-28 ENCOUNTER — Ambulatory Visit
Admission: RE | Admit: 2021-04-28 | Discharge: 2021-04-28 | Disposition: A | Discharge status: Home / Self Care | Payer: 59 | Source: Ambulatory Visit

## 2021-04-28 DIAGNOSIS — Z3491 Encounter for supervision of normal pregnancy, unspecified, first trimester: Secondary | ICD-10-CM

## 2021-04-28 DIAGNOSIS — Z3689 Encounter for other specified antenatal screening: Secondary | ICD-10-CM | POA: Insufficient documentation

## 2021-04-28 DIAGNOSIS — O039 Complete or unspecified spontaneous abortion without complication: Secondary | ICD-10-CM | POA: Insufficient documentation

## 2021-04-28 DIAGNOSIS — Z3A01 Less than 8 weeks gestation of pregnancy: Secondary | ICD-10-CM

## 2021-04-28 NOTE — Assessment & Plan Note (Signed)
Reassured patient that miscarriage is common with ~1/4 of women experiencing it in their lifetime. Reassured patient that there is nothing she did or did not do to cause this. Reviewed most common reason is presumed to be genetic abnormalities that allow a pregnancy to start but not continue past an early stage, but realistically we do not know the cause in most cases. Reviewed that studies show no definite difference between attempting another pregnancy sooner vs waiting, though some studies do show better live birth outcomes with trying sooner. All questions answered, encouraged her to contact clinic again if she becomes pregnant.  ?

## 2021-04-28 NOTE — Progress Notes (Signed)
? ?GYNECOLOGY OFFICE VISIT NOTE ? ?History:  ? Brittany Bernard is a 31 y.o. G3P1011 here today for [place] results. ? ?Patient seen in MAU on 04/14/21 w bleeding and abdominal pain ?HCG 254 at that time and TVUS showed IUGS and yolk sac as well as a L ovarian dermoid ?Today had [place] that showed no evidence of IUGS or YS c/w miscarriage ? ?Patient unfortunately had to wait a very long time for provider, >2 hours ?Understandably upset with news ?Very worried if two prior D&C's could have contributed to this outcome ? ?Health Maintenance Due  ?Topic Date Due  ? COVID-19 Vaccine (1) Never done  ? HIV Screening  Never done  ? Hepatitis C Screening  Never done  ? TETANUS/TDAP  Never done  ? PAP SMEAR-Modifier  Never done  ? INFLUENZA VACCINE  Never done  ? ? ?Past Medical History:  ?Diagnosis Date  ? Hyperemesis arising during pregnancy   ? Medical history non-contributory   ? ? ?Past Surgical History:  ?Procedure Laterality Date  ? TONSILLECTOMY    ? ? ?The following portions of the patient's history were reviewed and updated as appropriate: allergies, current medications, past family history, past medical history, past social history, past surgical history and problem list.  ? ?Health Maintenance:   ?Last pap: ?No results found for: DIAGPAP, HPV, HPVHIGH ?Not addressed ? ?Last mammogram:  ?N/a  ? ? ?Review of Systems:  ?Pertinent items noted in HPI and remainder of comprehensive ROS otherwise negative. ? ?Physical Exam:  ?BP 115/72   Pulse 64   Wt 257 lb 4.8 oz (116.7 kg)   LMP 03/09/2021   BMI 36.92 kg/m?  ?CONSTITUTIONAL: Well-developed, well-nourished female in no acute distress.  ?HEENT:  Normocephalic, atraumatic. External right and left ear normal. No scleral icterus.  ?NECK: Normal range of motion, supple, no masses noted on observation ?SKIN: No rash noted. Not diaphoretic. No erythema. No pallor. ?MUSCULOSKELETAL: Normal range of motion. No edema noted. ?NEUROLOGIC: Alert and oriented to person, place, and time.  Normal muscle tone coordination.  ?PSYCHIATRIC: Normal mood and affect. Normal behavior. Normal judgment and thought content. ?RESPIRATORY: Effort normal, no problems with respiration noted ? ? ?Labs and Imaging ?No results found for this or any previous visit (from the past 168 hour(s)). ?US OB Transvaginal ? ?Result Date: 04/28/2021 ?CLINICAL DATA:  Assess viability EXAM: TRANSVAGINAL OB ULTRASOUND TECHNIQUE: Transvaginal ultrasound was performed for complete evaluation of the gestation as well as the maternal uterus, adnexal regions, and pelvic cul-de-sac. COMPARISON:  Obstetric ultrasound 04/14/2021 FINDINGS: Intrauterine gestational sac: None Yolk sac:  Not Visualized. Embryo:  Not Visualized. Cardiac Activity: Not Visualized. Subchorionic hemorrhage:  None visualized. Maternal uterus/adnexae: The endometrium measures up to 9 mm in thickness there is no focal abnormality. The right ovary is normal. The left ovarian dermoid is again seen measuring 4.1 cm x 4.9 cm x 5.5 cm. There is trace free fluid in the pelvis, nonspecific. IMPRESSION: No intrauterine gestational sac identified. Given the presence of a probable gestational sac on the prior study, this is most consistent with completed miscarriage. Recommend correlation with beta HCG and continued obstetric follow-up. Electronically Signed   By: Lesia Hausen M.D.   On: 04/28/2021 11:01  ? ?US OB LESS THAN 14 WEEKS WITH OB TRANSVAGINAL ? ?Result Date: 04/14/2021 ?CLINICAL DATA:  Cramping and vaginal bleeding. Estimated gestational age of [redacted] weeks, 1 day by LMP. EXAM: OBSTETRIC <14 WK [place] AND TRANSVAGINAL OB US TECHNIQUE: Both transabdominal and transvaginal ultrasound examinations  were performed for complete evaluation of the gestation as well as the maternal uterus, adnexal regions, and pelvic cul-de-sac. Transvaginal technique was performed to assess early pregnancy. COMPARISON:  CT abdomen pelvis dated May 10, 2017. FINDINGS: Intrauterine gestational sac:  Single. Yolk sac:  Faintly visualized. Embryo:  Not Visualized. MSD: 3.4 mm   5 w   0 d Subchorionic hemorrhage:  None visualized. Maternal uterus/adnexae: The right ovary is unremarkable. 3.9 x 3.7 x 6.0 cm round hyperechoic mass in the left adnexa appears mildly increased in size compared to prior CT when it measured 3.8 x 3.6 x 4.9 cm. No free fluid in the pelvis. IMPRESSION: 1. Probable early intrauterine gestational sac and yolk sac, but no fetal pole or cardiac activity yet visualized. Recommend follow-up quantitative B-HCG levels and follow-up US in 14 days to assess viability. This recommendation follows SRU consensus guidelines: Diagnostic Criteria for Nonviable Pregnancy Early in the First Trimester. Malva Limes Med 2013; 614:4315-40. 2. 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 Electronically Signed   By: Obie Dredge M.D.   On: 04/14/2021 14:29      ?Assessment and Plan:  ? ?Problem List Items Addressed This Visit   ? ?  ? Other  ? Miscarriage  ?  Reassured patient that miscarriage is common with ~1/4 of women experiencing it in their lifetime. Reassured patient that there is nothing she did or did not do to cause this. Reviewed most common reason is presumed to be genetic abnormalities that allow a pregnancy to start but not continue past an early stage, but realistically we do not know the cause in most cases. Reviewed that studies show no definite difference between attempting another pregnancy sooner vs waiting, though some studies do show better live birth outcomes with trying sooner. All questions answered, encouraged her to contact clinic again if she becomes pregnant.  ?  ?  ? ? ?Routine preventative health maintenance measures emphasized. ?Please refer to After Visit Summary for other counseling recommendations.  ? ?Return as needed.   ? ?Total face-to-face time with patient: 20 minutes.  Over 50% of encounter was spent on counseling and coordination of care. ? ? ?Venora Maples, MD/MPH ?Attending Family Medicine Physician, Faculty Practice ?Center for [employer], [facility] ? ?

## 2021-05-27 ENCOUNTER — Other Ambulatory Visit: Payer: 59

## 2021-05-27 DIAGNOSIS — O039 Complete or unspecified spontaneous abortion without complication: Secondary | ICD-10-CM

## 2021-05-27 NOTE — Progress Notes (Signed)
Patient came into front office to report continued symptoms of pregnancy. States she is concerned she has hyperemesis. Beta HCG recommended by Shawnie Pons MD to confirm miscarriage or determine if new pregnancy. Will follow up with lab results.  ? Fleet Contras RN ?05/27/21 ?

## 2021-05-28 LAB — BETA HCG QUANT (REF LAB): hCG Quant: 35273 m[IU]/mL

## 2021-05-29 ENCOUNTER — Inpatient Hospital Stay (HOSPITAL_COMMUNITY): Payer: 59

## 2021-05-29 ENCOUNTER — Inpatient Hospital Stay (HOSPITAL_COMMUNITY)
Admission: AD | Admit: 2021-05-29 | Discharge: 2021-05-29 | Disposition: A | Discharge status: Home / Self Care | Payer: 59 | Attending: Obstetrics & Gynecology | Admitting: Obstetrics & Gynecology

## 2021-05-29 ENCOUNTER — Other Ambulatory Visit: Payer: Self-pay

## 2021-05-29 ENCOUNTER — Encounter (HOSPITAL_COMMUNITY): Payer: Self-pay

## 2021-05-29 DIAGNOSIS — R1031 Right lower quadrant pain: Secondary | ICD-10-CM | POA: Insufficient documentation

## 2021-05-29 DIAGNOSIS — O210 Mild hyperemesis gravidarum: Secondary | ICD-10-CM

## 2021-05-29 DIAGNOSIS — N83202 Unspecified ovarian cyst, left side: Secondary | ICD-10-CM | POA: Diagnosis not present

## 2021-05-29 DIAGNOSIS — E876 Hypokalemia: Secondary | ICD-10-CM

## 2021-05-29 DIAGNOSIS — O3481 Maternal care for other abnormalities of pelvic organs, first trimester: Secondary | ICD-10-CM | POA: Diagnosis not present

## 2021-05-29 DIAGNOSIS — O211 Hyperemesis gravidarum with metabolic disturbance: Secondary | ICD-10-CM | POA: Insufficient documentation

## 2021-05-29 DIAGNOSIS — Z3A01 Less than 8 weeks gestation of pregnancy: Secondary | ICD-10-CM | POA: Diagnosis not present

## 2021-05-29 DIAGNOSIS — O26891 Other specified pregnancy related conditions, first trimester: Secondary | ICD-10-CM | POA: Diagnosis present

## 2021-05-29 LAB — CBC
HCT: 42.7 % (ref 36.0–46.0)
Hemoglobin: 14.7 g/dL (ref 12.0–15.0)
MCH: 29.8 pg (ref 26.0–34.0)
MCHC: 34.4 g/dL (ref 30.0–36.0)
MCV: 86.4 fL (ref 80.0–100.0)
Platelets: 385 10*3/uL (ref 150–400)
RBC: 4.94 MIL/uL (ref 3.87–5.11)
RDW: 12.5 % (ref 11.5–15.5)
WBC: 17.5 10*3/uL — ABNORMAL HIGH (ref 4.0–10.5)
nRBC: 0 % (ref 0.0–0.2)

## 2021-05-29 LAB — BASIC METABOLIC PANEL
Anion gap: 13 (ref 5–15)
BUN: 12 mg/dL (ref 6–20)
CO2: 20 mmol/L — ABNORMAL LOW (ref 22–32)
Calcium: 9.9 mg/dL (ref 8.9–10.3)
Chloride: 102 mmol/L (ref 98–111)
Creatinine, Ser: 0.74 mg/dL (ref 0.44–1.00)
GFR, Estimated: >60 mL/min (ref >60)
Glucose, Bld: 125 mg/dL — ABNORMAL HIGH (ref 70–99)
Potassium: 3.2 mmol/L — ABNORMAL LOW (ref 3.5–5.1)
Sodium: 135 mmol/L (ref 135–145)

## 2021-05-29 LAB — WET PREP, GENITAL
Sperm: NONE SEEN
Trich, Wet Prep: NONE SEEN
WBC, Wet Prep HPF POC: >=10 — AB (ref <10)
Yeast Wet Prep HPF POC: NONE SEEN

## 2021-05-29 LAB — HCG, QUANTITATIVE, PREGNANCY: hCG, Beta Chain, Quant, S: 66532 m[IU]/mL — ABNORMAL HIGH (ref <5)

## 2021-05-29 MED ORDER — FAMOTIDINE 20 MG PO TABS: 20.0000 mg | ORAL_TABLET | Freq: Every day | ORAL | 0 refills | Status: AC

## 2021-05-29 MED ORDER — LACTATED RINGERS IV BOLUS
1000.0000 mL | Freq: Once | INTRAVENOUS | Status: AC
  Administered 2021-05-29: 1000 mL via INTRAVENOUS

## 2021-05-29 MED ORDER — SCOPOLAMINE 1 MG/3DAYS TD PT72: 1.0000 | MEDICATED_PATCH | TRANSDERMAL | 12 refills | Status: DC

## 2021-05-29 MED ORDER — POTASSIUM CHLORIDE CRYS ER 20 MEQ PO TBCR: 40.0000 meq | EXTENDED_RELEASE_TABLET | Freq: Two times a day (BID) | ORAL | 0 refills | Status: DC

## 2021-05-29 MED ORDER — FAMOTIDINE IN NACL 20-0.9 MG/50ML-% IV SOLN
20.0000 mg | Freq: Once | INTRAVENOUS | Status: AC
Start: 2021-05-29 — End: 2021-05-29
  Administered 2021-05-29: 20 mg via INTRAVENOUS
  Filled 2021-05-29: qty 50

## 2021-05-29 MED ORDER — PROCHLORPERAZINE EDISYLATE 10 MG/2ML IJ SOLN
10.0000 mg | Freq: Once | INTRAMUSCULAR | Status: AC
Start: 2021-05-29 — End: 2021-05-29
  Administered 2021-05-29: 10 mg via INTRAVENOUS
  Filled 2021-05-29: qty 2

## 2021-05-29 MED ORDER — SCOPOLAMINE 1 MG/3DAYS TD PT72
1.0000 | MEDICATED_PATCH | TRANSDERMAL | Status: DC
  Administered 2021-05-29: 1.5 mg via TRANSDERMAL
  Filled 2021-05-29: qty 1

## 2021-05-29 MED ORDER — PROCHLORPERAZINE MALEATE 10 MG PO TABS: 10.0000 mg | ORAL_TABLET | Freq: Four times a day (QID) | ORAL | 0 refills | Status: DC | PRN

## 2021-05-29 NOTE — MAU Provider Note (Signed)
?History  ?  ? ?CSN: [id number] ? ?Arrival date and time: 05/29/21 1030 ? ? Event Date/Time  ? First Provider Initiated Contact with Patient 05/29/21 1131   ?  ? ?Chief Complaint  ?Patient presents with  ? Abdominal Pain  ? Nausea  ? ?31 year old G4 P1-0-1-1 at unknown gestation presenting with nausea, vomiting and LAP.  Reports onset of symptoms 1 week ago.  States she cannot tolerate any p.o. for the last 3 days.  Denies sick contacts.  Lower abdominal pain is bilateral and central.  Rates pain 7 out of 10.  Has not treated it.  Denies vaginal bleeding. ? ?OB History   ? ? Gravida  ?4  ? Para  ?1  ? Term  ?1  ? Preterm  ?   ? AB  ?1  ? Living  ?1  ?  ? ? SAB  ?   ? IAB  ?1  ? Ectopic  ?   ? Multiple  ?   ? Live Births  ?1  ?   ?  ?  ? ? ?Past Medical History:  ?Diagnosis Date  ? Hyperemesis arising during pregnancy   ? Medical history non-contributory   ? ? ?Past Surgical History:  ?Procedure Laterality Date  ? TONSILLECTOMY    ? ? ?Family History  ?Problem Relation Age of Onset  ? Healthy Mother   ? Hypertension Father   ? ? ?Social History  ? ?Tobacco Use  ? Smoking status: Former  ?  Packs/day: 0.50  ?  Types: Cigarettes  ? Smokeless tobacco: Never  ?Vaping Use  ? Vaping Use: Never used  ?Substance Use Topics  ? Alcohol use: Not Currently  ? Drug use: Yes  ?  Types: Marijuana  ?  Comment: 04/14/2021  ? ? ?Allergies: No Known Allergies ? ?No medications prior to admission.  ? ? ?Review of Systems  ?Constitutional:  Positive for chills. Negative for fever.  ?Gastrointestinal:  Positive for abdominal pain, constipation, nausea and vomiting.  ?Genitourinary:  Negative for vaginal bleeding and vaginal discharge.  ?Physical Exam  ? ?Blood pressure 122/82, pulse 82, temperature 98.5 ?F (36.9 ?C), temperature source Oral, resp. rate 18, height 5\' 10"  (1.778 m), weight 114.3 kg, SpO2 97 %, unknown if currently breastfeeding. ? ?Physical Exam ?Vitals and nursing note reviewed.  ?Constitutional:   ?   General: She is not in  acute distress. ?   Appearance: Normal appearance.  ?HENT:  ?   Head: Normocephalic and atraumatic.  ?Cardiovascular:  ?   Rate and Rhythm: Normal rate.  ?Pulmonary:  ?   Effort: Pulmonary effort is normal. No respiratory distress.  ?Abdominal:  ?   General: There is no distension.  ?   Palpations: Abdomen is soft. There is no mass.  ?   Tenderness: There is no abdominal tenderness. There is no guarding or rebound.  ?   Hernia: No hernia is present.  ?Musculoskeletal:     ?   General: Normal range of motion.  ?   Cervical back: Normal range of motion.  ?Skin: ?   General: Skin is warm and dry.  ?Neurological:  ?   General: No focal deficit present.  ?   Mental Status: She is alert and oriented to person, place, and time.  ?Psychiatric:     ?   Mood and Affect: Mood normal.     ?   Behavior: Behavior normal.  ? ?Results for orders placed or performed during the hospital encounter of  05/29/21 (from the past 24 hour(s))  ?CBC     Status: Abnormal  ? Collection Time: 05/29/21 11:15 AM  ?Result Value Ref Range  ? WBC 17.5 (H) 4.0 - 10.5 K/uL  ? RBC 4.94 3.87 - 5.11 MIL/uL  ? Hemoglobin 14.7 12.0 - 15.0 g/dL  ? HCT 42.7 36.0 - 46.0 %  ? MCV 86.4 80.0 - 100.0 fL  ? MCH 29.8 26.0 - 34.0 pg  ? MCHC 34.4 30.0 - 36.0 g/dL  ? RDW 12.5 11.5 - 15.5 %  ? Platelets 385 150 - 400 K/uL  ? nRBC 0.0 0.0 - 0.2 %  ?hCG, quantitative, pregnancy     Status: Abnormal  ? Collection Time: 05/29/21 11:15 AM  ?Result Value Ref Range  ? hCG, Beta Chain, Quant, S 66,532 (H) <5 mIU/mL  ?Basic metabolic panel     Status: Abnormal  ? Collection Time: 05/29/21 11:15 AM  ?Result Value Ref Range  ? Sodium 135 135 - 145 mmol/L  ? Potassium 3.2 (L) 3.5 - 5.1 mmol/L  ? Chloride 102 98 - 111 mmol/L  ? CO2 20 (L) 22 - 32 mmol/L  ? Glucose, Bld 125 (H) 70 - 99 mg/dL  ? BUN 12 6 - 20 mg/dL  ? Creatinine, Ser 0.74 0.44 - 1.00 mg/dL  ? Calcium 9.9 8.9 - 10.3 mg/dL  ? GFR, Estimated >60 >60 mL/min  ? Anion gap 13 5 - 15  ?Wet prep, genital     Status: Abnormal   ? Collection Time: 05/29/21 11:35 AM  ? Specimen: Vaginal  ?Result Value Ref Range  ? Yeast Wet Prep HPF POC NONE SEEN NONE SEEN  ? Trich, Wet Prep NONE SEEN NONE SEEN  ? Clue Cells Wet Prep HPF POC PRESENT (A) NONE SEEN  ? WBC, Wet Prep HPF POC >=10 (A) <10  ? Sperm NONE SEEN   ? ?US OB LESS THAN 14 WEEKS WITH OB TRANSVAGINAL ? ?Result Date: 05/29/2021 ?CLINICAL DATA:  Positive pregnancy test with abdominal pain. EXAM: OBSTETRIC <14 WK US AND TRANSVAGINAL OB US TECHNIQUE: Both transabdominal and transvaginal ultrasound examinations were performed for complete evaluation of the gestation as well as the maternal uterus, adnexal regions, and pelvic cul-de-sac. Transvaginal technique was performed to assess early pregnancy. COMPARISON:  04/28/2021 FINDINGS: Intrauterine gestational sac: Single Yolk sac:  Visualized Embryo:  Visualized Cardiac Activity: Visualized Heart Rate: 107 bpm CRL:  2.8 mm   5 w   5 d                  US EDC: 01/24/2022 Subchorionic hemorrhage:  None visualized. Maternal uterus/adnexae: Maternal right ovary unremarkable. Maternal left ovary again demonstrates 6.3 cm dermoid as noted on CT scan 05/10/2017. IMPRESSION: Single Living intrauterine gestation at estimated 5 week 5 day gestational age by crown-rump length. 6.3 cm echogenic left ovarian lesion compatible with dermoid seen on CT scan 05/10/2017 although lesion has increased in size from 4.9 cm on that study. Electronically Signed   By: Kennith CenterEric  Mansell M.D.   On: 05/29/2021 13:29   ? ?MAU Course  ?Procedures ?LR ?Scopolamine ?Compazine ?Pepcid ? ?MDM ?Labs ordered and reviewed. Viable IUP on US. Feels better after fluids and meds.  Tolerating ice chips, declines anything more p.o. Discussed importance of regular medication regimen.  Stable for discharge home. ? ?Assessment and Plan  ? ?1. [redacted] weeks gestation of pregnancy   ?2. Morning sickness   ?3. Hypokalemia   ?4. Cyst of left ovary   ? ?  Discharge home ?Follow-up at [facility] as scheduled next  week ?Rx Compazine ?Rx scopolamine ?Rx Pepcid ?Rx K Dur ?Return precautions ? ? ?Allergies as of 05/29/2021   ?No Known Allergies ?  ? ?  ?Medication List  ?  ? ?TAKE these medications   ? ?famotidine 20 MG tablet ?Commonly known as: PEPCID ?Take 1 tablet (20 mg total) by mouth at bedtime. ?  ?potassium chloride SA 20 MEQ tablet ?Commonly known as: KLOR-CON M ?Take 2 tablets (40 mEq total) by mouth 2 (two) times daily. ?  ?prochlorperazine 10 MG tablet ?Commonly known as: COMPAZINE ?Take 1 tablet (10 mg total) by mouth every 6 (six) hours as needed for nausea or vomiting. ?  ?promethazine 25 MG suppository ?Commonly known as: Phenergan ?Place 1 suppository (25 mg total) rectally every 6 (six) hours as needed for nausea. ?  ?scopolamine 1 MG/3DAYS ?Commonly known as: TRANSDERM-SCOP ?Place 1 patch (1.5 mg total) onto the skin every 3 (three) days. ?Start taking on: June 01, 2021 ?  ? ?  ? ? ?Donette Larry, CNM ?05/29/2021, 4:11 PM  ?

## 2021-05-29 NOTE — MAU Note (Signed)
Brittany Bernard is a 31 y.o. here in MAU reporting: hx of hyperemesis with previous pregnancies, states she has progressively been sicker since she found she was pregnant.+ UPT a couple of weeks ago. Lower abdominal pain since about 0400. No bleeding or discharge. ? ?LMP: none since miscarriage ? ?Onset of complaint: ongoing ? ?Pain score: 7/10 ? ?Vitals:  ? 05/29/21 1054  ?BP: 131/82  ?Pulse: 82  ?Resp: 18  ?Temp: 98.5 ?F (36.9 ?C)  ?SpO2: 97%  ?   ?Lab orders placed from triage: UA ? ?

## 2021-05-31 LAB — GC/CHLAMYDIA PROBE AMP (~~LOC~~) NOT AT ARMC
Chlamydia: NEGATIVE
Comment: NEGATIVE
Comment: NORMAL
Neisseria Gonorrhea: NEGATIVE

## 2021-06-03 ENCOUNTER — Ambulatory Visit: Payer: 59 | Admitting: Student

## 2021-06-04 ENCOUNTER — Encounter (HOSPITAL_COMMUNITY): Payer: Self-pay | Admitting: Obstetrics & Gynecology

## 2021-06-04 ENCOUNTER — Other Ambulatory Visit: Payer: Self-pay

## 2021-06-04 ENCOUNTER — Inpatient Hospital Stay (HOSPITAL_COMMUNITY)
Admission: AD | Admit: 2021-06-04 | Discharge: 2021-06-06 | DRG: 833 | Disposition: A | Discharge status: Home / Self Care | Payer: 59 | Attending: Obstetrics & Gynecology | Admitting: Obstetrics & Gynecology

## 2021-06-04 DIAGNOSIS — O211 Hyperemesis gravidarum with metabolic disturbance: Secondary | ICD-10-CM | POA: Diagnosis present

## 2021-06-04 DIAGNOSIS — O210 Mild hyperemesis gravidarum: Secondary | ICD-10-CM | POA: Diagnosis present

## 2021-06-04 DIAGNOSIS — O99891 Other specified diseases and conditions complicating pregnancy: Secondary | ICD-10-CM | POA: Diagnosis present

## 2021-06-04 DIAGNOSIS — Z3A01 Less than 8 weeks gestation of pregnancy: Secondary | ICD-10-CM | POA: Diagnosis not present

## 2021-06-04 DIAGNOSIS — R079 Chest pain, unspecified: Secondary | ICD-10-CM | POA: Diagnosis present

## 2021-06-04 DIAGNOSIS — Z87891 Personal history of nicotine dependence: Secondary | ICD-10-CM

## 2021-06-04 LAB — COMPREHENSIVE METABOLIC PANEL
ALT: 15 U/L (ref 0–44)
AST: 15 U/L (ref 15–41)
Albumin: 4.5 g/dL (ref 3.5–5.0)
Alkaline Phosphatase: 73 U/L (ref 38–126)
Anion gap: 14 (ref 5–15)
BUN: 12 mg/dL (ref 6–20)
CO2: 29 mmol/L (ref 22–32)
Calcium: 9.7 mg/dL (ref 8.9–10.3)
Chloride: 89 mmol/L — ABNORMAL LOW (ref 98–111)
Creatinine, Ser: 0.99 mg/dL (ref 0.44–1.00)
GFR, Estimated: >60 mL/min (ref >60)
Glucose, Bld: 137 mg/dL — ABNORMAL HIGH (ref 70–99)
Potassium: 2.4 mmol/L — CL (ref 3.5–5.1)
Sodium: 132 mmol/L — ABNORMAL LOW (ref 135–145)
Total Bilirubin: 0.8 mg/dL (ref 0.3–1.2)
Total Protein: 8.2 g/dL — ABNORMAL HIGH (ref 6.5–8.1)

## 2021-06-04 LAB — CBC
HCT: 45.8 % (ref 36.0–46.0)
Hemoglobin: 15.8 g/dL — ABNORMAL HIGH (ref 12.0–15.0)
MCH: 29.4 pg (ref 26.0–34.0)
MCHC: 34.5 g/dL (ref 30.0–36.0)
MCV: 85.3 fL (ref 80.0–100.0)
Platelets: 405 10*3/uL — ABNORMAL HIGH (ref 150–400)
RBC: 5.37 MIL/uL — ABNORMAL HIGH (ref 3.87–5.11)
RDW: 12.4 % (ref 11.5–15.5)
WBC: 21 10*3/uL — ABNORMAL HIGH (ref 4.0–10.5)
nRBC: 0 % (ref 0.0–0.2)

## 2021-06-04 LAB — URINALYSIS, ROUTINE W REFLEX MICROSCOPIC
Bacteria, UA: NONE SEEN
Bilirubin Urine: NEGATIVE
Glucose, UA: NEGATIVE mg/dL
Ketones, ur: 20 mg/dL — AB
Leukocytes,Ua: NEGATIVE
Nitrite: NEGATIVE
Protein, ur: 30 mg/dL — AB
Specific Gravity, Urine: 1.029 (ref 1.005–1.030)
pH: 6 (ref 5.0–8.0)

## 2021-06-04 MED ORDER — POTASSIUM CHLORIDE 10 MEQ/100ML IV SOLN
10.0000 meq | INTRAVENOUS | Status: AC
  Administered 2021-06-04 (×5): 10 meq via INTRAVENOUS
  Filled 2021-06-04 (×5): qty 100

## 2021-06-04 MED ORDER — THIAMINE HCL 100 MG PO TABS
100.0000 mg | ORAL_TABLET | Freq: Every day | ORAL | Status: DC
  Administered 2021-06-05: 100 mg via ORAL
  Filled 2021-06-04 (×3): qty 1

## 2021-06-04 MED ORDER — FAMOTIDINE IN NACL 20-0.9 MG/50ML-% IV SOLN
20.0000 mg | Freq: Two times a day (BID) | INTRAVENOUS | Status: DC
  Administered 2021-06-04 – 2021-06-05 (×3): 20 mg via INTRAVENOUS
  Filled 2021-06-04 (×3): qty 50

## 2021-06-04 MED ORDER — SCOPOLAMINE 1 MG/3DAYS TD PT72
1.0000 | MEDICATED_PATCH | TRANSDERMAL | Status: DC
  Administered 2021-06-04: 1.5 mg via TRANSDERMAL
  Filled 2021-06-04: qty 1

## 2021-06-04 MED ORDER — LACTATED RINGERS IV BOLUS: 1000.0000 mL | Freq: Once | INTRAVENOUS | Status: AC

## 2021-06-04 MED ORDER — ONDANSETRON HCL 4 MG/2ML IJ SOLN
4.0000 mg | Freq: Four times a day (QID) | INTRAMUSCULAR | Status: DC | PRN
  Administered 2021-06-05: 4 mg via INTRAVENOUS
  Filled 2021-06-04 (×2): qty 2

## 2021-06-04 MED ORDER — THIAMINE HCL 100 MG/ML IJ SOLN
100.0000 mg | Freq: Every day | INTRAMUSCULAR | Status: DC
  Administered 2021-06-04: 100 mg via INTRAVENOUS
  Filled 2021-06-04 (×3): qty 1

## 2021-06-04 MED ORDER — FOLIC ACID 5 MG/ML IJ SOLN
1.0000 mg | Freq: Every day | INTRAMUSCULAR | Status: DC
  Administered 2021-06-04: 1 mg via INTRAVENOUS
  Filled 2021-06-04 (×3): qty 0.2

## 2021-06-04 MED ORDER — FOLIC ACID 1 MG PO TABS
1.0000 mg | ORAL_TABLET | Freq: Every day | ORAL | Status: DC
  Administered 2021-06-05: 1 mg via ORAL
  Filled 2021-06-04 (×2): qty 1

## 2021-06-04 MED ORDER — ZOLPIDEM TARTRATE 5 MG PO TABS
5.0000 mg | ORAL_TABLET | Freq: Every evening | ORAL | Status: DC | PRN
  Administered 2021-06-04 – 2021-06-05 (×2): 5 mg via ORAL
  Filled 2021-06-04 (×2): qty 1

## 2021-06-04 MED ORDER — PROCHLORPERAZINE EDISYLATE 10 MG/2ML IJ SOLN
10.0000 mg | Freq: Four times a day (QID) | INTRAMUSCULAR | Status: DC | PRN
  Administered 2021-06-04: 10 mg via INTRAVENOUS
  Filled 2021-06-04 (×2): qty 2

## 2021-06-04 MED ORDER — DOCUSATE SODIUM 100 MG PO CAPS
100.0000 mg | ORAL_CAPSULE | Freq: Every day | ORAL | Status: DC
  Administered 2021-06-05: 100 mg via ORAL
  Filled 2021-06-04: qty 1

## 2021-06-04 MED ORDER — LACTATED RINGERS IV SOLN: INTRAVENOUS | Status: DC

## 2021-06-04 MED ORDER — METOCLOPRAMIDE HCL 5 MG/ML IJ SOLN
10.0000 mg | Freq: Four times a day (QID) | INTRAMUSCULAR | Status: DC | PRN
  Administered 2021-06-04 – 2021-06-05 (×2): 10 mg via INTRAVENOUS
  Filled 2021-06-04 (×2): qty 2

## 2021-06-04 MED ORDER — ACETAMINOPHEN 325 MG PO TABS: 650.0000 mg | ORAL_TABLET | ORAL | Status: DC | PRN

## 2021-06-04 MED ORDER — FAMOTIDINE IN NACL 20-0.9 MG/50ML-% IV SOLN
20.0000 mg | Freq: Once | INTRAVENOUS | Status: AC
Start: 2021-06-04 — End: 2021-06-04
  Administered 2021-06-04: 20 mg via INTRAVENOUS
  Filled 2021-06-04: qty 50

## 2021-06-04 MED ORDER — PRENATAL MULTIVITAMIN CH
1.0000 | ORAL_TABLET | Freq: Every day | ORAL | Status: DC
  Administered 2021-06-05: 1 via ORAL
  Filled 2021-06-04: qty 1

## 2021-06-04 MED ORDER — LACTATED RINGERS IV BOLUS
1000.0000 mL | Freq: Once | INTRAVENOUS | Status: AC
  Administered 2021-06-04: 1000 mL via INTRAVENOUS

## 2021-06-04 MED ORDER — SODIUM CHLORIDE 0.9 % IV SOLN
25.0000 mg | Freq: Once | INTRAVENOUS | Status: AC
  Administered 2021-06-04: 25 mg via INTRAVENOUS
  Filled 2021-06-04: qty 1

## 2021-06-04 MED ORDER — CALCIUM CARBONATE ANTACID 500 MG PO CHEW: 2.0000 | CHEWABLE_TABLET | ORAL | Status: DC | PRN

## 2021-06-04 NOTE — MAU Note (Signed)
Brittany Bernard is a 31 y.o. at [redacted]w[redacted]d here in MAU reporting: she can't eat or keep anything down.  States she feels defeated and can't care for herself or get any sleep.  Also states chest is hurting and has SOB. ? ?Onset of complaint: 6 days ago ?Pain score: 8 ?Vitals:  ? 06/04/21 0959  ?BP: 124/86  ?Pulse: (!) 126  ?Resp: 18  ?Temp: 97.9 ?F (36.6 ?C)  ?SpO2: 97%  ?   ?FHT:N/A ?Lab orders placed from triage:   UA ?

## 2021-06-04 NOTE — MAU Provider Note (Signed)
?History  ?  ? ?CSN: ID:[id number] ? ?Arrival date and time: 06/04/21 0943 ? ? Event Date/Time  ? First Provider Initiated Contact with Patient 06/04/21 1027   ?  ? ?Chief Complaint  ?Patient presents with  ? Nausea  ? Emesis  ? ?Brittany Bernard is a 31 y.o. G4P1011 at [redacted]w[redacted]d who presents today with nausea/vomiting and chest pain. She reports that she has had the chest pain for 2 days. She reports that it is constant, feels like fullness, and is directly substernal. She rates the pain 8/10. She has not been able to take anything for the pain because she has not been able to keep anything down. She was here on 05/29/2021 and was given Pepcid, scopolamine patch, compazine PO and phenergan suppositories to take. She reports that she has not been able to take those medications because she is vomiting. She did not replace the scopolamine patch because she did not feel like it was working.  ? ?Patient denies any current marijuana use. She reports that she had it used it prior to finding out she was pregnant, and she tried it once to see if it would help with the nausea. When it didn't help she stopped using it. She also reports that several years ago she had cannabinoid hyperemesis ? ?Emesis  ?This is a new problem. The current episode started 1 to 4 weeks ago. The problem occurs more than 10 times per day. The problem has been unchanged. The emesis has an appearance of stomach contents and bile. There has been no fever. Associated symptoms include chest pain. Pertinent negatives include no fever. Risk factors: pregnancy. She has tried nothing for the symptoms. The treatment provided no relief.  ? ?OB History   ? ? Gravida  ?4  ? Para  ?1  ? Term  ?1  ? Preterm  ?   ? AB  ?1  ? Living  ?1  ?  ? ? SAB  ?   ? IAB  ?1  ? Ectopic  ?   ? Multiple  ?   ? Live Births  ?1  ?   ?  ?  ? ? ?Past Medical History:  ?Diagnosis Date  ? Hyperemesis arising during pregnancy   ? Medical history non-contributory   ? ? ?Past Surgical History:   ?Procedure Laterality Date  ? TONSILLECTOMY    ? ? ?Family History  ?Problem Relation Age of Onset  ? Healthy Mother   ? Hypertension Father   ? ? ?Social History  ? ?Tobacco Use  ? Smoking status: Former  ?  Packs/day: 0.50  ?  Types: Cigarettes  ? Smokeless tobacco: Never  ?Vaping Use  ? Vaping Use: Never used  ?Substance Use Topics  ? Alcohol use: Not Currently  ? Drug use: Not Currently  ?  Types: Marijuana  ?  Comment: 04/14/2021  ? ? ?Allergies: No Known Allergies ? ?Medications Prior to Admission  ?Medication Sig Dispense Refill Last Dose  ? famotidine (PEPCID) 20 MG tablet Take 1 tablet (20 mg total) by mouth at bedtime. 30 tablet 0 06/01/2021  ? potassium chloride SA (KLOR-CON M) 20 MEQ tablet Take 2 tablets (40 mEq total) by mouth 2 (two) times daily. 20 tablet 0   ? prochlorperazine (COMPAZINE) 10 MG tablet Take 1 tablet (10 mg total) by mouth every 6 (six) hours as needed for nausea or vomiting. 30 tablet 0   ? promethazine (PHENERGAN) 25 MG suppository Place 1 suppository (25 mg total) rectally  every 6 (six) hours as needed for nausea. 12 suppository 1   ? scopolamine (TRANSDERM-SCOP) 1 MG/3DAYS Place 1 patch (1.5 mg total) onto the skin every 3 (three) days. 10 patch 12   ? ? ?Review of Systems  ?Constitutional:  Negative for fever.  ?Cardiovascular:  Positive for chest pain.  ?Gastrointestinal:  Positive for vomiting.  ?Physical Exam  ? ?Blood pressure 124/86, pulse (!) 126, temperature 97.9 ?F (36.6 ?C), temperature source Oral, resp. rate 18, height 5\' 10"  (1.778 m), weight 109.4 kg, SpO2 97 %, unknown if currently breastfeeding. ? ?Physical Exam ?Constitutional:   ?   Appearance: She is well-developed.  ?HENT:  ?   Head: Normocephalic.  ?Eyes:  ?   Pupils: Pupils are equal, round, and reactive to light.  ?Cardiovascular:  ?   Rate and Rhythm: Normal rate and regular rhythm.  ?   Heart sounds: Normal heart sounds.  ?Pulmonary:  ?   Effort: Pulmonary effort is normal. No respiratory distress.  ?    Breath sounds: Normal breath sounds.  ?Abdominal:  ?   Palpations: Abdomen is soft.  ?   Tenderness: There is no abdominal tenderness.  ?Genitourinary: ?   Vagina: No bleeding. Vaginal discharge: mucusy. ?   Comments:  ?  ? ? ?Musculoskeletal:     ?   General: Normal range of motion.  ?   Cervical back: Normal range of motion and neck supple.  ?Skin: ?   General: Skin is warm and dry.  ?Neurological:  ?   Mental Status: She is alert and oriented to person, place, and time.  ?Psychiatric:     ?   Mood and Affect: Mood normal.     ?   Behavior: Behavior normal.  ? ? ?Results for orders placed or performed during the hospital encounter of 06/04/21 (from the past 24 hour(s))  ?Urinalysis, Routine w reflex microscopic Urine, Clean Catch     Status: Abnormal  ? Collection Time: 06/04/21 10:12 AM  ?Result Value Ref Range  ? Color, Urine AMBER (A) YELLOW  ? APPearance HAZY (A) CLEAR  ? Specific Gravity, Urine 1.029 1.005 - 1.030  ? pH 6.0 5.0 - 8.0  ? Glucose, UA NEGATIVE NEGATIVE mg/dL  ? Hgb urine dipstick MODERATE (A) NEGATIVE  ? Bilirubin Urine NEGATIVE NEGATIVE  ? Ketones, ur 20 (A) NEGATIVE mg/dL  ? Protein, ur 30 (A) NEGATIVE mg/dL  ? Nitrite NEGATIVE NEGATIVE  ? Leukocytes,Ua NEGATIVE NEGATIVE  ? RBC / HPF 11-20 0 - 5 RBC/hpf  ? WBC, UA 0-5 0 - 5 WBC/hpf  ? Bacteria, UA NONE SEEN NONE SEEN  ? Squamous Epithelial / LPF 0-5 0 - 5  ? Mucus PRESENT   ?CBC     Status: Abnormal  ? Collection Time: 06/04/21 11:01 AM  ?Result Value Ref Range  ? WBC 21.0 (H) 4.0 - 10.5 K/uL  ? RBC 5.37 (H) 3.87 - 5.11 MIL/uL  ? Hemoglobin 15.8 (H) 12.0 - 15.0 g/dL  ? HCT 45.8 36.0 - 46.0 %  ? MCV 85.3 80.0 - 100.0 fL  ? MCH 29.4 26.0 - 34.0 pg  ? MCHC 34.5 30.0 - 36.0 g/dL  ? RDW 12.4 11.5 - 15.5 %  ? Platelets 405 (H) 150 - 400 K/uL  ? nRBC 0.0 0.0 - 0.2 %  ?Comprehensive metabolic panel     Status: Abnormal  ? Collection Time: 06/04/21 11:01 AM  ?Result Value Ref Range  ? Sodium 132 (L) 135 - 145 mmol/L  ?  Potassium 2.4 (LL) 3.5 - 5.1  mmol/L  ? Chloride 89 (L) 98 - 111 mmol/L  ? CO2 29 22 - 32 mmol/L  ? Glucose, Bld 137 (H) 70 - 99 mg/dL  ? BUN 12 6 - 20 mg/dL  ? Creatinine, Ser 0.99 0.44 - 1.00 mg/dL  ? Calcium 9.7 8.9 - 10.3 mg/dL  ? Total Protein 8.2 (H) 6.5 - 8.1 g/dL  ? Albumin 4.5 3.5 - 5.0 g/dL  ? AST 15 15 - 41 U/L  ? ALT 15 0 - 44 U/L  ? Alkaline Phosphatase 73 38 - 126 U/L  ? Total Bilirubin 0.8 0.3 - 1.2 mg/dL  ? GFR, Estimated >60 >60 mL/min  ? Anion gap 14 5 - 15  ? ? ? ?MAU Course  ?Procedures ? ?MDM ?1040: Spoke with Dr. Jorene Minors in Cardiology. She read the patient's EKG and reviewed prior EKG on file. She states that she feels patient has sinus tachycardia due to dehydration and the ST abnormality noted in V6 are from increased HR. She recommends fluids, checking CMET and treating as likely musculoskeletal pain from prolonged vomiting.  ? ?12:18pm: DW Dr. Harolyn Rutherford, patient with hypokalemia and persistent nausea/vomiting with weightloss, will admit for hyperemesis.  ? ?Assessment and Plan  ?Hyperemesis with dehydration and hypokalemia ? ?Admit to [facility] ?Dr. Harolyn Rutherford aware ? ?Marcille Buffy DNP, CNM  ?06/04/21  12:20 PM  ? ?

## 2021-06-04 NOTE — H&P (Signed)
? ?History  ?  ? ?CSN: ID:[id number] ? ?Arrival date and time: 06/04/21 0943 ? ? Event Date/Time  ? First Provider Initiated Contact with Patient 06/04/21 1027   ?  ? ?Chief Complaint  ?Patient presents with  ? Nausea  ? Emesis  ? ?Brittany Bernard is a 31 y.o. G4P1011 at [redacted]w[redacted]d who presents today with nausea/vomiting and chest pain. She reports that she has had the chest pain for 2 days. She reports that it is constant, feels like fullness, and is directly substernal. She rates the pain 8/10. She has not been able to take anything for the pain because she has not been able to keep anything down. She was here on 05/29/2021 and was given Pepcid, scopolamine patch, compazine PO and phenergan suppositories to take. She reports that she has not been able to take those medications because she is vomiting. She did not replace the scopolamine patch because she did not feel like it was working.  ? ? ?Patient denies any current marijuana use. She reports that she had it used it prior to finding out she was pregnant, and she tried it once to see if it would help with the nausea. When it didn't help she stopped using it. She also reports that several years ago she had cannabinoid hyperemesis ? ? ?Emesis  ?This is a new problem. The current episode started 1 to 4 weeks ago. The problem occurs more than 10 times per day. The problem has been unchanged. The emesis has an appearance of stomach contents and bile. There has been no fever. Associated symptoms include chest pain. Pertinent negatives include no fever. Risk factors: pregnancy. She has tried nothing for the symptoms. The treatment provided no relief.  ? ?OB History   ? ? Gravida  ?4  ? Para  ?1  ? Term  ?1  ? Preterm  ?   ? AB  ?1  ? Living  ?1  ?  ? ? SAB  ?   ? IAB  ?1  ? Ectopic  ?   ? Multiple  ?   ? Live Births  ?1  ?   ?  ?  ? ? ?Past Medical History:  ?Diagnosis Date  ? Hyperemesis arising during pregnancy   ? Medical history non-contributory   ? ? ?Past Surgical History:   ?Procedure Laterality Date  ? TONSILLECTOMY    ? ? ?Family History  ?Problem Relation Age of Onset  ? Healthy Mother   ? Hypertension Father   ? ? ?Social History  ? ?Tobacco Use  ? Smoking status: Former  ?  Packs/day: 0.50  ?  Types: Cigarettes  ? Smokeless tobacco: Never  ?Vaping Use  ? Vaping Use: Never used  ?Substance Use Topics  ? Alcohol use: Not Currently  ? Drug use: Not Currently  ?  Types: Marijuana  ?  Comment: 04/14/2021  ? ? ?Allergies: No Known Allergies ? ?Medications Prior to Admission  ?Medication Sig Dispense Refill Last Dose  ? famotidine (PEPCID) 20 MG tablet Take 1 tablet (20 mg total) by mouth at bedtime. 30 tablet 0 06/01/2021  ? potassium chloride SA (KLOR-CON M) 20 MEQ tablet Take 2 tablets (40 mEq total) by mouth 2 (two) times daily. 20 tablet 0   ? prochlorperazine (COMPAZINE) 10 MG tablet Take 1 tablet (10 mg total) by mouth every 6 (six) hours as needed for nausea or vomiting. 30 tablet 0   ? promethazine (PHENERGAN) 25 MG suppository Place 1 suppository (25  mg total) rectally every 6 (six) hours as needed for nausea. 12 suppository 1   ? scopolamine (TRANSDERM-SCOP) 1 MG/3DAYS Place 1 patch (1.5 mg total) onto the skin every 3 (three) days. 10 patch 12   ? ? ?Review of Systems  ?Constitutional:  Negative for fever.  ?Cardiovascular:  Positive for chest pain.  ?Gastrointestinal:  Positive for vomiting.  ?Physical Exam  ? ?Blood pressure 124/86, pulse (!) 126, temperature 97.9 ?F (36.6 ?C), temperature source Oral, resp. rate 18, height 5\' 10"  (1.778 m), weight 109.4 kg, SpO2 97 %, unknown if currently breastfeeding. ? ?Physical Exam ?Constitutional:   ?   Appearance: She is well-developed.  ?HENT:  ?   Head: Normocephalic.  ?Eyes:  ?   Pupils: Pupils are equal, round, and reactive to light.  ?Cardiovascular:  ?   Rate and Rhythm: Normal rate and regular rhythm.  ?   Heart sounds: Normal heart sounds.  ?Pulmonary:  ?   Effort: Pulmonary effort is normal. No respiratory distress.  ?    Breath sounds: Normal breath sounds.  ?Abdominal:  ?   Palpations: Abdomen is soft.  ?   Tenderness: There is no abdominal tenderness.  ?Genitourinary: ?   Vagina: No bleeding. Vaginal discharge: mucusy. ?   Comments:  ?  ? ? ?Musculoskeletal:     ?   General: Normal range of motion.  ?   Cervical back: Normal range of motion and neck supple.  ?Skin: ?   General: Skin is warm and dry.  ?Neurological:  ?   Mental Status: She is alert and oriented to person, place, and time.  ?Psychiatric:     ?   Mood and Affect: Mood normal.     ?   Behavior: Behavior normal.  ? ? ?Results for orders placed or performed during the hospital encounter of 06/04/21 (from the past 24 hour(s))  ?Urinalysis, Routine w reflex microscopic Urine, Clean Catch     Status: Abnormal  ? Collection Time: 06/04/21 10:12 AM  ?Result Value Ref Range  ? Color, Urine AMBER (A) YELLOW  ? APPearance HAZY (A) CLEAR  ? Specific Gravity, Urine 1.029 1.005 - 1.030  ? pH 6.0 5.0 - 8.0  ? Glucose, UA NEGATIVE NEGATIVE mg/dL  ? Hgb urine dipstick MODERATE (A) NEGATIVE  ? Bilirubin Urine NEGATIVE NEGATIVE  ? Ketones, ur 20 (A) NEGATIVE mg/dL  ? Protein, ur 30 (A) NEGATIVE mg/dL  ? Nitrite NEGATIVE NEGATIVE  ? Leukocytes,Ua NEGATIVE NEGATIVE  ? RBC / HPF 11-20 0 - 5 RBC/hpf  ? WBC, UA 0-5 0 - 5 WBC/hpf  ? Bacteria, UA NONE SEEN NONE SEEN  ? Squamous Epithelial / LPF 0-5 0 - 5  ? Mucus PRESENT   ?CBC     Status: Abnormal  ? Collection Time: 06/04/21 11:01 AM  ?Result Value Ref Range  ? WBC 21.0 (H) 4.0 - 10.5 K/uL  ? RBC 5.37 (H) 3.87 - 5.11 MIL/uL  ? Hemoglobin 15.8 (H) 12.0 - 15.0 g/dL  ? HCT 45.8 36.0 - 46.0 %  ? MCV 85.3 80.0 - 100.0 fL  ? MCH 29.4 26.0 - 34.0 pg  ? MCHC 34.5 30.0 - 36.0 g/dL  ? RDW 12.4 11.5 - 15.5 %  ? Platelets 405 (H) 150 - 400 K/uL  ? nRBC 0.0 0.0 - 0.2 %  ?Comprehensive metabolic panel     Status: Abnormal  ? Collection Time: 06/04/21 11:01 AM  ?Result Value Ref Range  ? Sodium 132 (L) 135 -  145 mmol/L  ? Potassium 2.4 (LL) 3.5 - 5.1  mmol/L  ? Chloride 89 (L) 98 - 111 mmol/L  ? CO2 29 22 - 32 mmol/L  ? Glucose, Bld 137 (H) 70 - 99 mg/dL  ? BUN 12 6 - 20 mg/dL  ? Creatinine, Ser 0.99 0.44 - 1.00 mg/dL  ? Calcium 9.7 8.9 - 10.3 mg/dL  ? Total Protein 8.2 (H) 6.5 - 8.1 g/dL  ? Albumin 4.5 3.5 - 5.0 g/dL  ? AST 15 15 - 41 U/L  ? ALT 15 0 - 44 U/L  ? Alkaline Phosphatase 73 38 - 126 U/L  ? Total Bilirubin 0.8 0.3 - 1.2 mg/dL  ? GFR, Estimated >60 >60 mL/min  ? Anion gap 14 5 - 15  ? ? ? ?MAU Course  ?Procedures ? ?MDM ?1040: Spoke with Dr. Jorene Minors in Cardiology. She read the patient's EKG and reviewed prior EKG on file. She states that she feels patient has sinus tachycardia due to dehydration and the ST abnormality noted in V6 are from increased HR. She recommends fluids, checking CMET and treating as likely musculoskeletal pain from prolonged vomiting.  ? ?12:18pm: DW Dr. Harolyn Rutherford, patient with hypokalemia and persistent nausea/vomiting with weightloss, will admit for hyperemesis.  ? ?Assessment and Plan  ?Hyperemesis with dehydration and hypokalemia ? ?Admit to [facility] ?Dr. Harolyn Rutherford will put in orders  ? ?Marcille Buffy DNP, CNM  ?06/04/21  12:20 PM  ?

## 2021-06-04 NOTE — Plan of Care (Signed)

## 2021-06-04 NOTE — MAU Note (Signed)
Potassium 2.4, reported to H. Hogan,CNM ?

## 2021-06-05 ENCOUNTER — Encounter (HOSPITAL_COMMUNITY): Payer: Self-pay | Admitting: Obstetrics & Gynecology

## 2021-06-05 LAB — COMPREHENSIVE METABOLIC PANEL
ALT: 11 U/L (ref 0–44)
AST: 11 U/L — ABNORMAL LOW (ref 15–41)
Albumin: 3.3 g/dL — ABNORMAL LOW (ref 3.5–5.0)
Alkaline Phosphatase: 51 U/L (ref 38–126)
Anion gap: 9 (ref 5–15)
BUN: 12 mg/dL (ref 6–20)
CO2: 28 mmol/L (ref 22–32)
Calcium: 8.7 mg/dL — ABNORMAL LOW (ref 8.9–10.3)
Chloride: 96 mmol/L — ABNORMAL LOW (ref 98–111)
Creatinine, Ser: 0.89 mg/dL (ref 0.44–1.00)
GFR, Estimated: >60 mL/min (ref >60)
Glucose, Bld: 99 mg/dL (ref 70–99)
Potassium: 2.6 mmol/L — CL (ref 3.5–5.1)
Sodium: 133 mmol/L — ABNORMAL LOW (ref 135–145)
Total Bilirubin: 0.9 mg/dL (ref 0.3–1.2)
Total Protein: 6.3 g/dL — ABNORMAL LOW (ref 6.5–8.1)

## 2021-06-05 LAB — TSH: TSH: 1.436 u[IU]/mL (ref 0.350–4.500)

## 2021-06-05 LAB — MAGNESIUM
Magnesium: 2.2 mg/dL (ref 1.7–2.4)
Magnesium: 2.3 mg/dL (ref 1.7–2.4)

## 2021-06-05 MED ORDER — POTASSIUM CHLORIDE CRYS ER 20 MEQ PO TBCR
40.0000 meq | EXTENDED_RELEASE_TABLET | Freq: Two times a day (BID) | ORAL | Status: DC
  Administered 2021-06-05 (×2): 40 meq via ORAL
  Filled 2021-06-05 (×2): qty 2

## 2021-06-05 NOTE — Progress Notes (Signed)
Patient ID: Brittany Bernard, female   DOB: 10/22/1990, 31 y.o.   MRN: [id number] ?ACULTY PRACTICE ANTEPARTUM COMPREHENSIVE PROGRESS NOTE ? ?Brittany Bernard is a 31 y.o. G4P1011 at [redacted]w[redacted]d  who is admitted for hyperemesis.   ? ?Length of Stay:  1  Days ? ?Subjective: ?Pt reports feeling a little better this morning. No N/V since admission. Has tolerated some sips of clears.  ?gina. ? ?Vitals:  Blood pressure 116/78, pulse 76, temperature 98.3 ?F (36.8 ?C), temperature source Oral, resp. rate 17, height 5\' 10"  (1.778 m), weight 109.8 kg, SpO2 98 %, unknown if currently breastfeeding. ?Physical Examination: ?Lungs clear Heart RRR ?Abd soft Faint BS ?Ext non tender ? ?Fetal Monitoring:   + FHT's ? ?Labs:  ?Results for orders placed or performed during the hospital encounter of 06/04/21 (from the past 24 hour(s))  ?Urinalysis, Routine w reflex microscopic Urine, Clean Catch  ? Collection Time: 06/04/21 10:12 AM  ?Result Value Ref Range  ? Color, Urine AMBER (A) YELLOW  ? APPearance HAZY (A) CLEAR  ? Specific Gravity, Urine 1.029 1.005 - 1.030  ? pH 6.0 5.0 - 8.0  ? Glucose, UA NEGATIVE NEGATIVE mg/dL  ? Hgb urine dipstick MODERATE (A) NEGATIVE  ? Bilirubin Urine NEGATIVE NEGATIVE  ? Ketones, ur 20 (A) NEGATIVE mg/dL  ? Protein, ur 30 (A) NEGATIVE mg/dL  ? Nitrite NEGATIVE NEGATIVE  ? Leukocytes,Ua NEGATIVE NEGATIVE  ? RBC / HPF 11-20 0 - 5 RBC/hpf  ? WBC, UA 0-5 0 - 5 WBC/hpf  ? Bacteria, UA NONE SEEN NONE SEEN  ? Squamous Epithelial / LPF 0-5 0 - 5  ? Mucus PRESENT   ?CBC  ? Collection Time: 06/04/21 11:01 AM  ?Result Value Ref Range  ? WBC 21.0 (H) 4.0 - 10.5 K/uL  ? RBC 5.37 (H) 3.87 - 5.11 MIL/uL  ? Hemoglobin 15.8 (H) 12.0 - 15.0 g/dL  ? HCT 45.8 36.0 - 46.0 %  ? MCV 85.3 80.0 - 100.0 fL  ? MCH 29.4 26.0 - 34.0 pg  ? MCHC 34.5 30.0 - 36.0 g/dL  ? RDW 12.4 11.5 - 15.5 %  ? Platelets 405 (H) 150 - 400 K/uL  ? nRBC 0.0 0.0 - 0.2 %  ?Comprehensive metabolic panel  ? Collection Time: 06/04/21 11:01 AM  ?Result Value Ref Range   ? Sodium 132 (L) 135 - 145 mmol/L  ? Potassium 2.4 (LL) 3.5 - 5.1 mmol/L  ? Chloride 89 (L) 98 - 111 mmol/L  ? CO2 29 22 - 32 mmol/L  ? Glucose, Bld 137 (H) 70 - 99 mg/dL  ? BUN 12 6 - 20 mg/dL  ? Creatinine, Ser 0.99 0.44 - 1.00 mg/dL  ? Calcium 9.7 8.9 - 10.3 mg/dL  ? Total Protein 8.2 (H) 6.5 - 8.1 g/dL  ? Albumin 4.5 3.5 - 5.0 g/dL  ? AST 15 15 - 41 U/L  ? ALT 15 0 - 44 U/L  ? Alkaline Phosphatase 73 38 - 126 U/L  ? Total Bilirubin 0.8 0.3 - 1.2 mg/dL  ? GFR, Estimated >60 >60 mL/min  ? Anion gap 14 5 - 15  ?Comprehensive metabolic panel  ? Collection Time: 06/05/21  5:20 AM  ?Result Value Ref Range  ? Sodium 133 (L) 135 - 145 mmol/L  ? Potassium 2.6 (LL) 3.5 - 5.1 mmol/L  ? Chloride 96 (L) 98 - 111 mmol/L  ? CO2 28 22 - 32 mmol/L  ? Glucose, Bld 99 70 - 99 mg/dL  ? BUN 12 6 -  20 mg/dL  ? Creatinine, Ser 0.89 0.44 - 1.00 mg/dL  ? Calcium 8.7 (L) 8.9 - 10.3 mg/dL  ? Total Protein 6.3 (L) 6.5 - 8.1 g/dL  ? Albumin 3.3 (L) 3.5 - 5.0 g/dL  ? AST 11 (L) 15 - 41 U/L  ? ALT 11 0 - 44 U/L  ? Alkaline Phosphatase 51 38 - 126 U/L  ? Total Bilirubin 0.9 0.3 - 1.2 mg/dL  ? GFR, Estimated >60 >60 mL/min  ? Anion gap 9 5 - 15  ?Magnesium  ? Collection Time: 06/05/21  5:20 AM  ?Result Value Ref Range  ? Magnesium 2.2 1.7 - 2.4 mg/dL  ?TSH  ? Collection Time: 06/05/21  5:20 AM  ?Result Value Ref Range  ? TSH 1.436 0.350 - 4.500 uIU/mL  ? ? ?Imaging Studies:    ?NA  ? ?Medications:  Scheduled ? docusate sodium  100 mg Oral Daily  ? folic acid  1 mg Oral Daily  ? Or  ? folic acid  1 mg Intravenous Daily  ? potassium chloride  40 mEq Oral BID  ? prenatal multivitamin  1 tablet Oral Q1200  ? scopolamine  1 patch Transdermal Q72H  ? thiamine  100 mg Oral Daily  ? Or  ? thiamine  100 mg Intravenous Daily  ? ?I have reviewed the patient's current medications. ? ?ASSESSMENT: ?IUP  6 5/7 weeks ?Hyperemesis ?Hypokalemia ? ?PLAN: ?Stable. Improved. Continue to replace K. Check magnesium. Continue with IV fluids and antiemetic  medications ?Continue routine antenatal care. ? ? ?Hermina Staggers ?06/05/2021,9:27 AM ? ?  ?

## 2021-06-06 ENCOUNTER — Other Ambulatory Visit: Payer: Self-pay | Admitting: Obstetrics and Gynecology

## 2021-06-06 LAB — COMPREHENSIVE METABOLIC PANEL
ALT: 13 U/L (ref 0–44)
AST: 13 U/L — ABNORMAL LOW (ref 15–41)
Albumin: 3 g/dL — ABNORMAL LOW (ref 3.5–5.0)
Alkaline Phosphatase: 43 U/L (ref 38–126)
Anion gap: 6 (ref 5–15)
BUN: 6 mg/dL (ref 6–20)
CO2: 30 mmol/L (ref 22–32)
Calcium: 8.5 mg/dL — ABNORMAL LOW (ref 8.9–10.3)
Chloride: 100 mmol/L (ref 98–111)
Creatinine, Ser: 0.96 mg/dL (ref 0.44–1.00)
GFR, Estimated: >60 mL/min (ref >60)
Glucose, Bld: 102 mg/dL — ABNORMAL HIGH (ref 70–99)
Potassium: 2.9 mmol/L — ABNORMAL LOW (ref 3.5–5.1)
Sodium: 136 mmol/L (ref 135–145)
Total Bilirubin: 0.9 mg/dL (ref 0.3–1.2)
Total Protein: 5.9 g/dL — ABNORMAL LOW (ref 6.5–8.1)

## 2021-06-06 MED ORDER — ONDANSETRON 4 MG PO TBDP
4.0000 mg | ORAL_TABLET | Freq: Four times a day (QID) | ORAL | Status: DC | PRN
Start: 2021-06-06 — End: 2021-06-06

## 2021-06-06 MED ORDER — POTASSIUM CHLORIDE CRYS ER 20 MEQ PO TBCR: 40.0000 meq | EXTENDED_RELEASE_TABLET | Freq: Two times a day (BID) | ORAL | 0 refills | Status: AC

## 2021-06-06 MED ORDER — ONDANSETRON 4 MG PO TBDP: 4.0000 mg | ORAL_TABLET | Freq: Four times a day (QID) | ORAL | 2 refills | Status: AC | PRN

## 2021-06-06 MED ORDER — SCOPOLAMINE 1 MG/3DAYS TD PT72: 1.0000 | MEDICATED_PATCH | TRANSDERMAL | 2 refills | Status: DC

## 2021-06-06 NOTE — Discharge Summary (Signed)
Patient ID: ?Brittany Bernard ?MRN: [id number] ?DOB/AGE: 1990/04/19 31 y.o. ? ?Admit date: 06/04/2021 ?Discharge date: 06/06/2021 ? ?Admission Diagnoses: Hyperemesis, IUP 31 1/2 weeks ? ?Discharge Diagnoses: SAA, undelivered ? ?Prenatal Procedures: none ? ? ? ?Hospital Course:  ?This is a 31 y.o. G4P1011 with IUP at [redacted]w[redacted]d admitted for hyperemesis. She was placed on IV fluids and antiemetics with good response. Her diet was advance to regular and she tolerated well. ?Her K was replaced.   She was deemed stable for discharge to home with outpatient follow up. ? ?Discharge Exam: ?Temp:  [98 ?F (36.7 ?C)-98.4 ?F (36.9 ?C)] 98.3 ?F (36.8 ?C) (04/09 4765) ?Pulse Rate:  [70-77] 76 (04/09 0827) ?Resp:  [17-18] 17 (04/09 4650) ?BP: (102-125)/(51-70) 123/69 (04/09 0827) ?SpO2:  [98 %-100 %] 99 % (04/09 0807) ?Weight:  [110.9 kg] 110.9 kg (04/09 0334) ?Physical Examination: ?CONSTITUTIONAL: Well-developed, well-nourished female in no acute distress.  ?HENT:  Normocephalic, atraumatic, External right and left ear normal. Oropharynx is clear and moist ?EYES: Conjunctivae and EOM are normal. Pupils are equal, round, and reactive to light. No scleral icterus.  ?NECK: Normal range of motion, supple, no masses ?SKIN: Skin is warm and dry. No rash noted. Not diaphoretic. No erythema. No pallor. ?NEUROLGIC: Alert and oriented to person, place, and time. Normal reflexes, muscle tone coordination. No cranial nerve deficit noted. ?PSYCHIATRIC: Normal mood and affect. Normal behavior. Normal judgment and thought content. ?CARDIOVASCULAR: Normal heart rate noted, regular rhythm ?RESPIRATORY: Effort and breath sounds normal, no problems with respiration noted ?MUSCULOSKELETAL: Normal range of motion. No edema and no tenderness. 2+ distal pulses. ?ABDOMEN: Soft, nontender, nondistended, gravid. ?CERVIX:  Deffered ? ?Fetal monitoring: + FHT's ? ?Significant Diagnostic Studies:  ?Results for orders placed or performed during the hospital encounter of  06/04/21 (from the past 168 hour(s))  ?Urinalysis, Routine w reflex microscopic Urine, Clean Catch  ? Collection Time: 06/04/21 10:12 AM  ?Result Value Ref Range  ? Color, Urine AMBER (A) YELLOW  ? APPearance HAZY (A) CLEAR  ? Specific Gravity, Urine 1.029 1.005 - 1.030  ? pH 6.0 5.0 - 8.0  ? Glucose, UA NEGATIVE NEGATIVE mg/dL  ? Hgb urine dipstick MODERATE (A) NEGATIVE  ? Bilirubin Urine NEGATIVE NEGATIVE  ? Ketones, ur 20 (A) NEGATIVE mg/dL  ? Protein, ur 30 (A) NEGATIVE mg/dL  ? Nitrite NEGATIVE NEGATIVE  ? Leukocytes,Ua NEGATIVE NEGATIVE  ? RBC / HPF 11-20 0 - 5 RBC/hpf  ? WBC, UA 0-5 0 - 5 WBC/hpf  ? Bacteria, UA NONE SEEN NONE SEEN  ? Squamous Epithelial / LPF 0-5 0 - 5  ? Mucus PRESENT   ?CBC  ? Collection Time: 06/04/21 11:01 AM  ?Result Value Ref Range  ? WBC 21.0 (H) 4.0 - 10.5 K/uL  ? RBC 5.37 (H) 3.87 - 5.11 MIL/uL  ? Hemoglobin 15.8 (H) 12.0 - 15.0 g/dL  ? HCT 45.8 36.0 - 46.0 %  ? MCV 85.3 80.0 - 100.0 fL  ? MCH 29.4 26.0 - 34.0 pg  ? MCHC 34.5 30.0 - 36.0 g/dL  ? RDW 12.4 11.5 - 15.5 %  ? Platelets 405 (H) 150 - 400 K/uL  ? nRBC 0.0 0.0 - 0.2 %  ?Comprehensive metabolic panel  ? Collection Time: 06/04/21 11:01 AM  ?Result Value Ref Range  ? Sodium 132 (L) 135 - 145 mmol/L  ? Potassium 2.4 (LL) 3.5 - 5.1 mmol/L  ? Chloride 89 (L) 98 - 111 mmol/L  ? CO2 29 22 - 32 mmol/L  ?  Glucose, Bld 137 (H) 70 - 99 mg/dL  ? BUN 12 6 - 20 mg/dL  ? Creatinine, Ser 0.99 0.44 - 1.00 mg/dL  ? Calcium 9.7 8.9 - 10.3 mg/dL  ? Total Protein 8.2 (H) 6.5 - 8.1 g/dL  ? Albumin 4.5 3.5 - 5.0 g/dL  ? AST 15 15 - 41 U/L  ? ALT 15 0 - 44 U/L  ? Alkaline Phosphatase 73 38 - 126 U/L  ? Total Bilirubin 0.8 0.3 - 1.2 mg/dL  ? GFR, Estimated >60 >60 mL/min  ? Anion gap 14 5 - 15  ?Comprehensive metabolic panel  ? Collection Time: 06/05/21  5:20 AM  ?Result Value Ref Range  ? Sodium 133 (L) 135 - 145 mmol/L  ? Potassium 2.6 (LL) 3.5 - 5.1 mmol/L  ? Chloride 96 (L) 98 - 111 mmol/L  ? CO2 28 22 - 32 mmol/L  ? Glucose, Bld 99 70 - 99  mg/dL  ? BUN 12 6 - 20 mg/dL  ? Creatinine, Ser 0.89 0.44 - 1.00 mg/dL  ? Calcium 8.7 (L) 8.9 - 10.3 mg/dL  ? Total Protein 6.3 (L) 6.5 - 8.1 g/dL  ? Albumin 3.3 (L) 3.5 - 5.0 g/dL  ? AST 11 (L) 15 - 41 U/L  ? ALT 11 0 - 44 U/L  ? Alkaline Phosphatase 51 38 - 126 U/L  ? Total Bilirubin 0.9 0.3 - 1.2 mg/dL  ? GFR, Estimated >60 >60 mL/min  ? Anion gap 9 5 - 15  ?Magnesium  ? Collection Time: 06/05/21  5:20 AM  ?Result Value Ref Range  ? Magnesium 2.2 1.7 - 2.4 mg/dL  ?TSH  ? Collection Time: 06/05/21  5:20 AM  ?Result Value Ref Range  ? TSH 1.436 0.350 - 4.500 uIU/mL  ?Magnesium  ? Collection Time: 06/05/21  9:36 AM  ?Result Value Ref Range  ? Magnesium 2.3 1.7 - 2.4 mg/dL  ?Comprehensive metabolic panel  ? Collection Time: 06/06/21  5:48 AM  ?Result Value Ref Range  ? Sodium 136 135 - 145 mmol/L  ? Potassium 2.9 (L) 3.5 - 5.1 mmol/L  ? Chloride 100 98 - 111 mmol/L  ? CO2 30 22 - 32 mmol/L  ? Glucose, Bld 102 (H) 70 - 99 mg/dL  ? BUN 6 6 - 20 mg/dL  ? Creatinine, Ser 0.96 0.44 - 1.00 mg/dL  ? Calcium 8.5 (L) 8.9 - 10.3 mg/dL  ? Total Protein 5.9 (L) 6.5 - 8.1 g/dL  ? Albumin 3.0 (L) 3.5 - 5.0 g/dL  ? AST 13 (L) 15 - 41 U/L  ? ALT 13 0 - 44 U/L  ? Alkaline Phosphatase 43 38 - 126 U/L  ? Total Bilirubin 0.9 0.3 - 1.2 mg/dL  ? GFR, Estimated >60 >60 mL/min  ? Anion gap 6 5 - 15  ? ? ?Discharge Condition: Stable ? ?Disposition: Discharge disposition: 01-Home or Self Care ? ? ? ? ? ? ? ?Discharge Instructions   ? ? Discharge activity:  No Restrictions   Complete by: As directed ?  ? Discharge diet:  No restrictions   Complete by: As directed ?  ? No sexual activity restrictions   Complete by: As directed ?  ? ?  ? ?Allergies as of 06/06/2021   ?No Known Allergies ?  ? ?  ?Medication List  ?  ? ?STOP taking these medications   ? ?prochlorperazine 10 MG tablet ?Commonly known as: COMPAZINE ?  ? ?  ? ?TAKE these medications   ? ?famotidine  20 MG tablet ?Commonly known as: PEPCID ?Take 1 tablet (20 mg total) by mouth at  bedtime. ?  ?ondansetron 4 MG disintegrating tablet ?Commonly known as: ZOFRAN-ODT ?Take 1 tablet (4 mg total) by mouth every 6 (six) hours as needed for nausea or vomiting. ?  ?potassium chloride SA 20 MEQ tablet ?Commonly known as: KLOR-CON M ?Take 2 tablets (40 mEq total) by mouth 2 (two) times daily. ?  ?scopolamine 1 MG/3DAYS ?Commonly known as: TRANSDERM-SCOP ?Place 1 patch (1.5 mg total) onto the skin every 3 (three) days. ?  ? ?  ? ? Follow-up Information   ? ? [facility] [employer] [facility]. Schedule an appointment as soon as possible for a visit.   ?Specialty: Obstetrics and Gynecology ?Contact information: ?[address] ?[place] [address] ?[phone] ? ?  ?  ? ?  ?  ? ?  ? ? ?Signed: ?Hermina Staggers M.D. ?06/06/2021, 8:46 AM  ?

## 2022-06-24 IMAGING — US US OB < 14 WEEKS - US OB TV
1 series · 15 of 28 positions shown · non-contrast
Comparison: CT abdomen pelvis dated May 10, 2017.

CLINICAL DATA: Cramping and vaginal bleeding. Estimated gestational
age of 5 weeks, 1 day by LMP.

EXAM:
OBSTETRIC <14 WK US AND TRANSVAGINAL OB US
TECHNIQUE: Both transabdominal and transvaginal ultrasound examinations were
performed for complete evaluation of the gestation as well as the
maternal uterus, adnexal regions, and pelvic cul-de-sac.
Transvaginal technique was performed to assess early pregnancy.

[Series 1: us ob < 14 weeks - us ob tv · 15 of 46 slices shown]
[im 1/46]
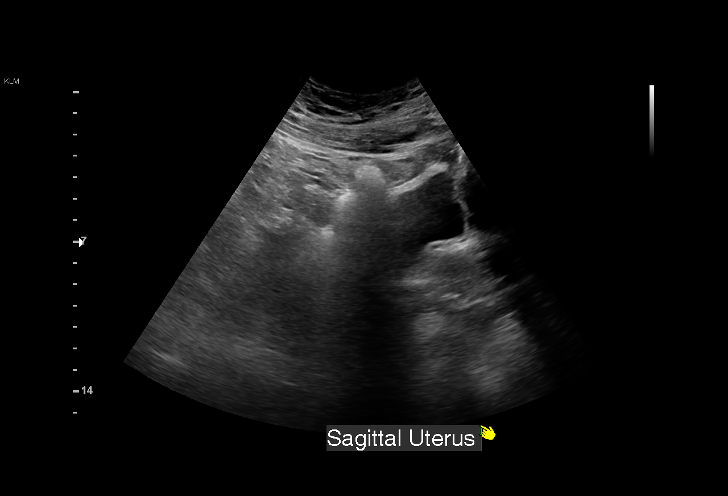
[im 4/46]
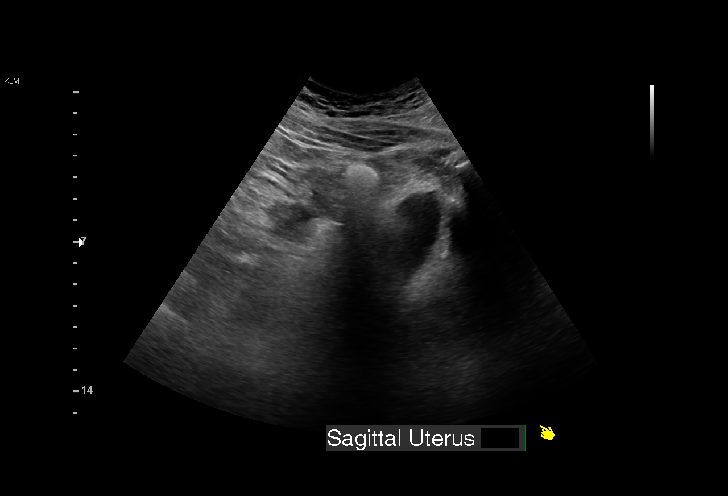
[im 7/46]
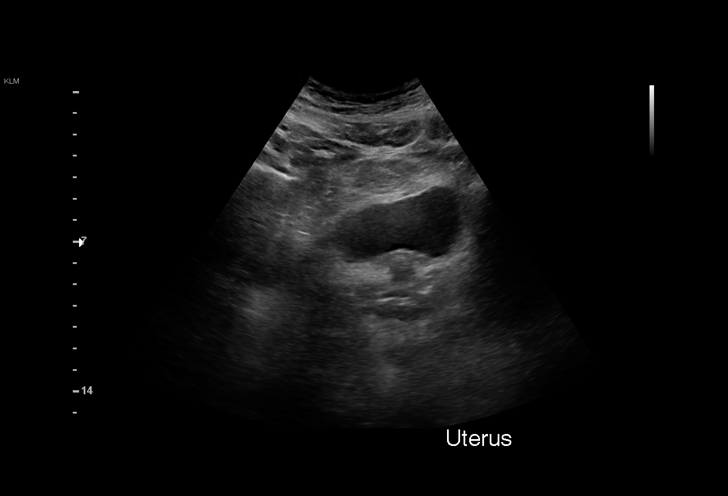
[im 11/46]
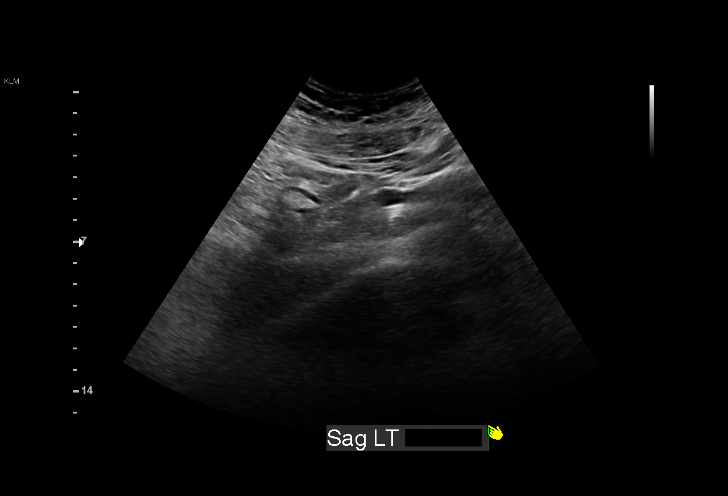
[im 14/46]
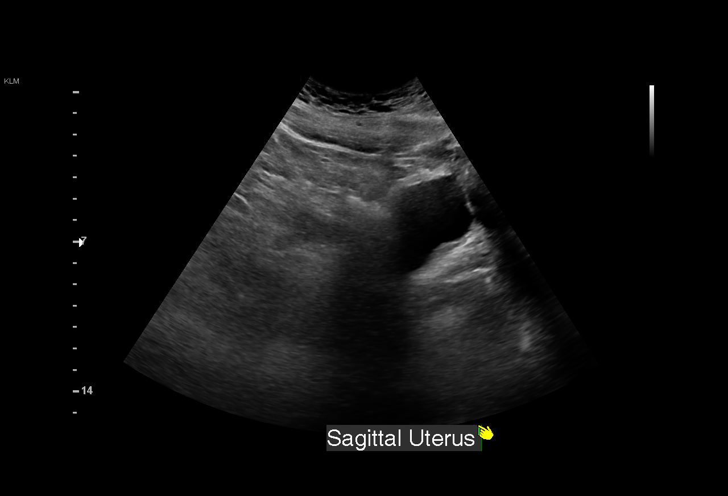
[im 17/46]
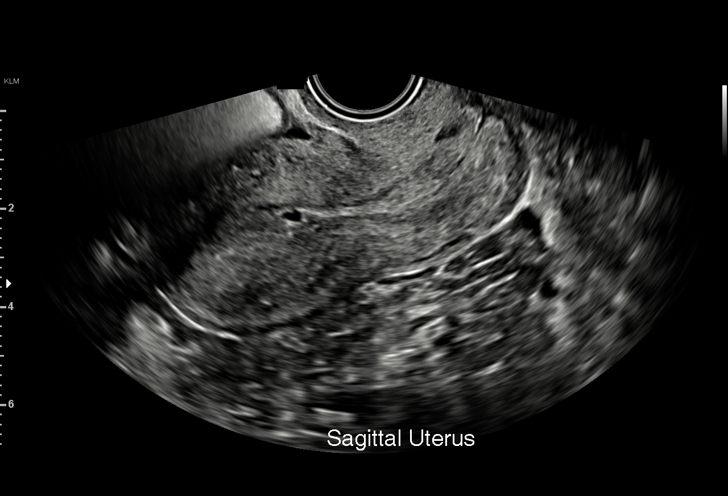
[im 21/46]
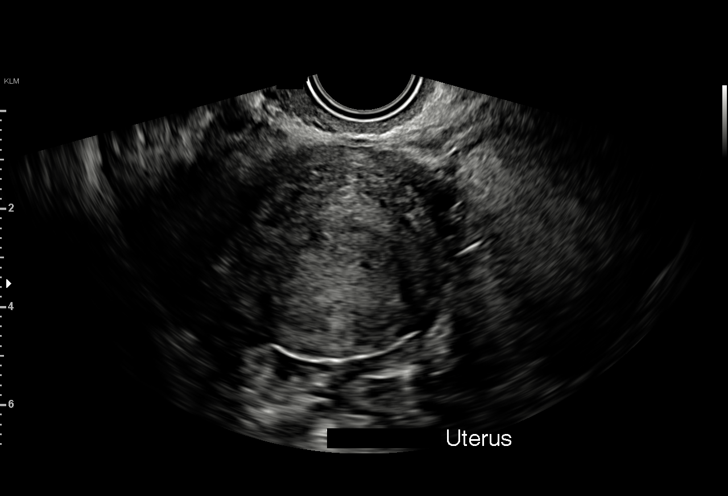
[im 24/46]
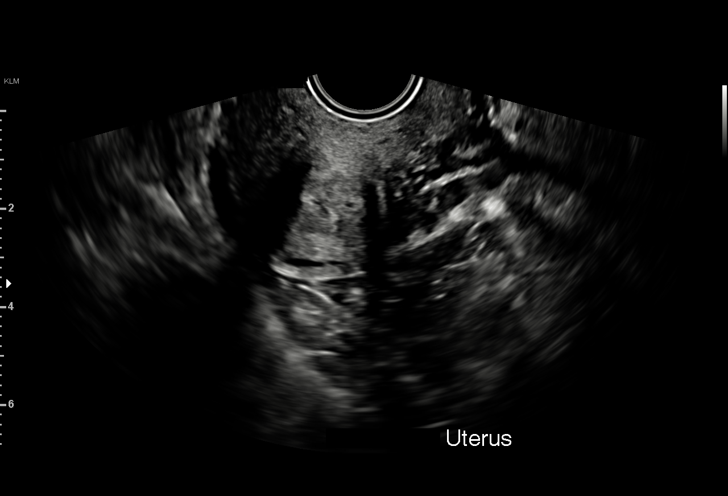
[im 26/46]
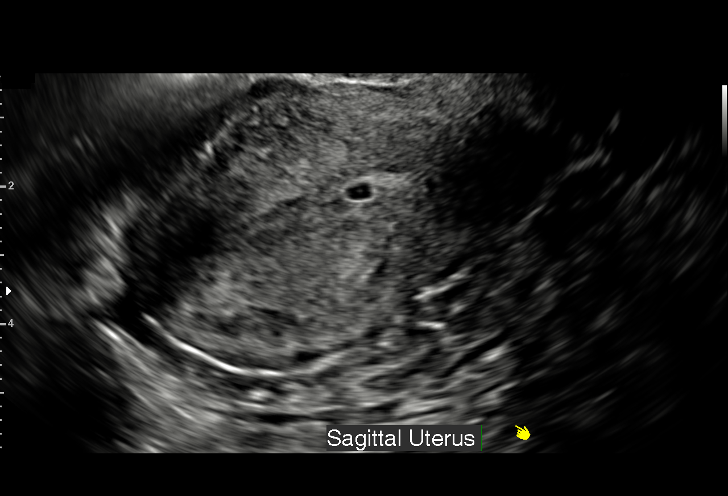
[im 29/46]
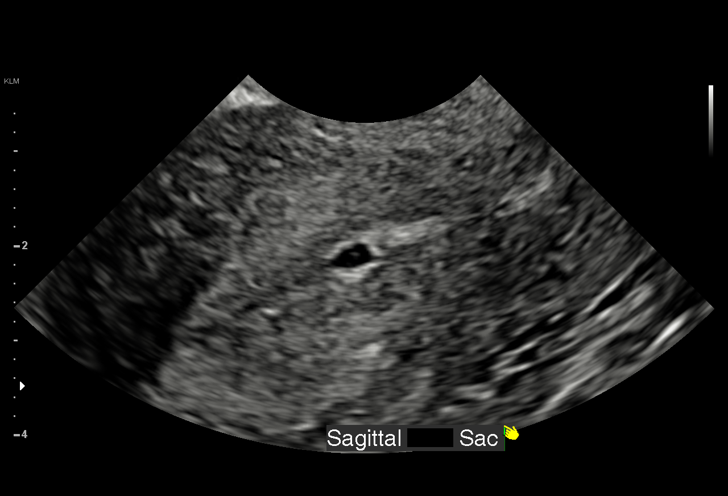
[im 32/46]
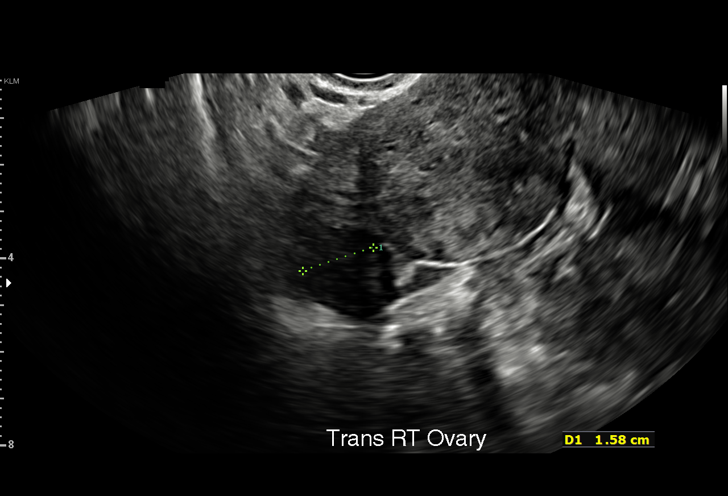
[im 36/46]
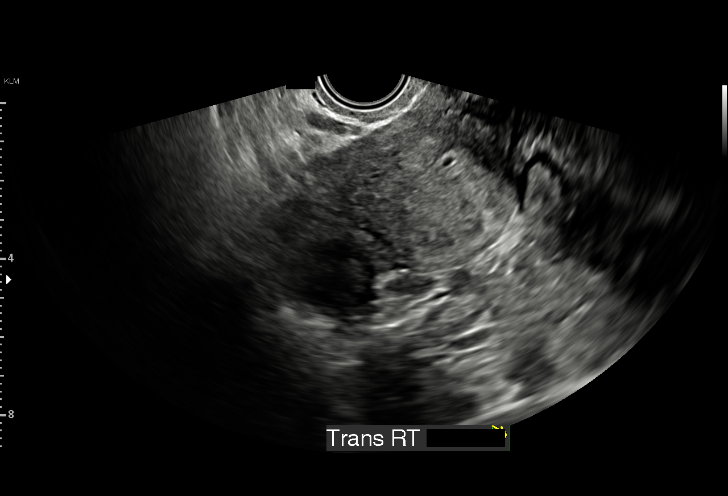
[im 39/46]
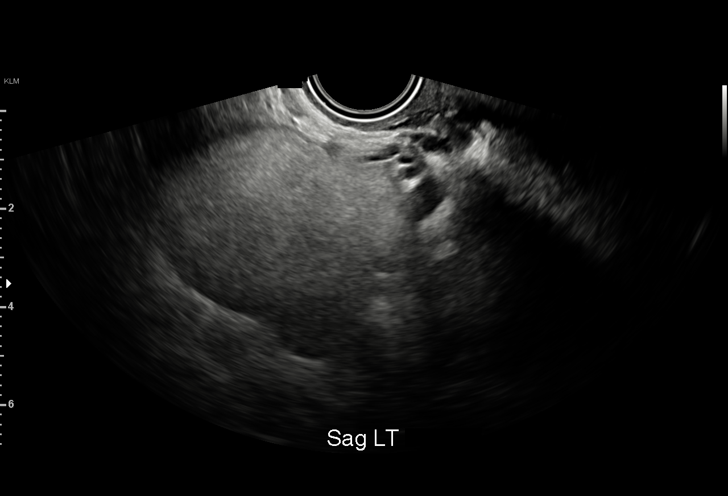
[im 42/46]
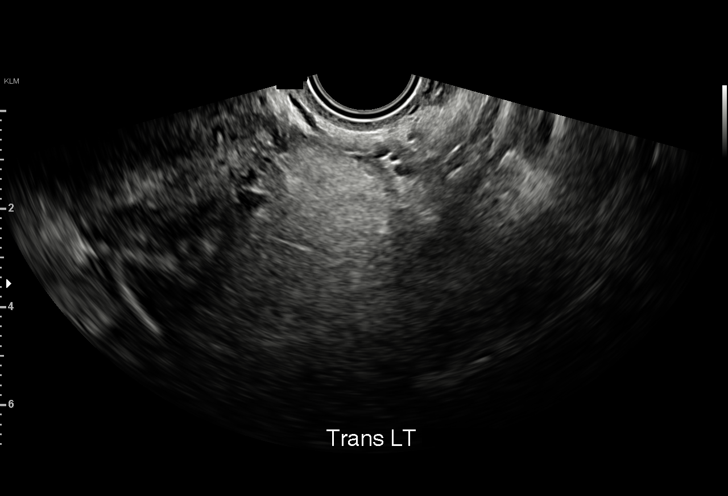
[im 46/46]
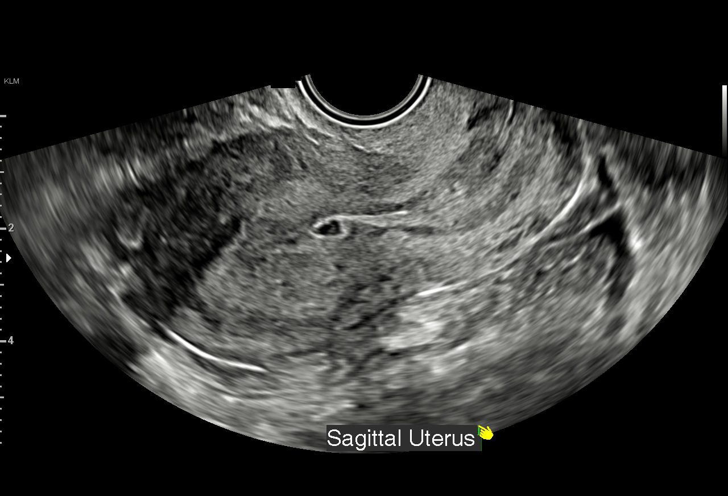

[15 of 28 positions shown; findings below may reference images not displayed]

FINDINGS: Intrauterine gestational sac: Single.

Yolk sac:  Faintly visualized.

Embryo:  Not Visualized.

MSD: 3.4 mm   5 w   0 d

Subchorionic hemorrhage:  None visualized.

Maternal uterus/adnexae: The right ovary is unremarkable. 3.9 x
x 6.0 cm round hyperechoic mass in the left adnexa appears mildly
increased in size compared to prior CT when it measured 3.8 x 3.6 x
4.9 cm.

No free fluid in the pelvis.
IMPRESSION: 1. Probable early intrauterine gestational sac and yolk sac, but no
fetal pole or cardiac activity yet visualized. Recommend follow-up
quantitative B-HCG levels and follow-up US in 14 days to assess
viability. This recommendation follows SRU consensus guidelines:
Diagnostic Criteria for Nonviable Pregnancy Early in the First
Trimester. N Engl J Med 1828; [DATE].
2. 6.0 cm left ovarian dermoid, mildly increased in size since [DATE].

## 2022-07-08 IMAGING — US US OB TRANSVAGINAL
1 series · 15 of 28 positions shown · non-contrast
Comparison: Obstetric ultrasound 04/14/2021

CLINICAL DATA: Assess viability

EXAM:
TRANSVAGINAL OB ULTRASOUND
TECHNIQUE: Transvaginal ultrasound was performed for complete evaluation of the
gestation as well as the maternal uterus, adnexal regions, and
pelvic cul-de-sac.

[Series 1: us ob transvaginal · 15 of 46 slices shown]
[im 1/46]
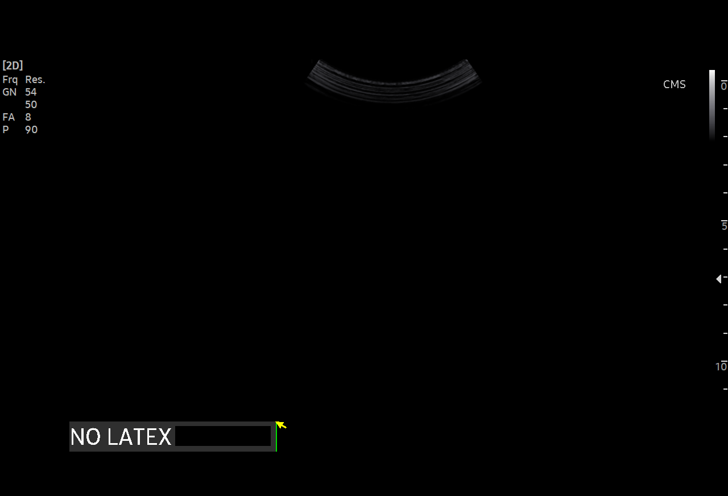
[im 4/46]
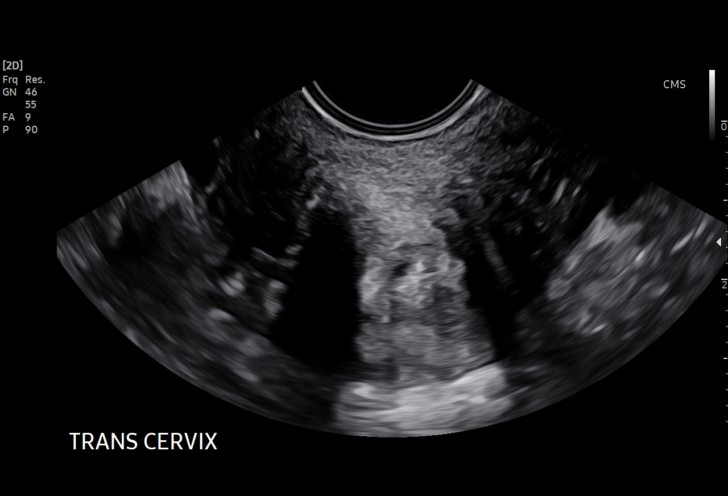
[im 7/46]
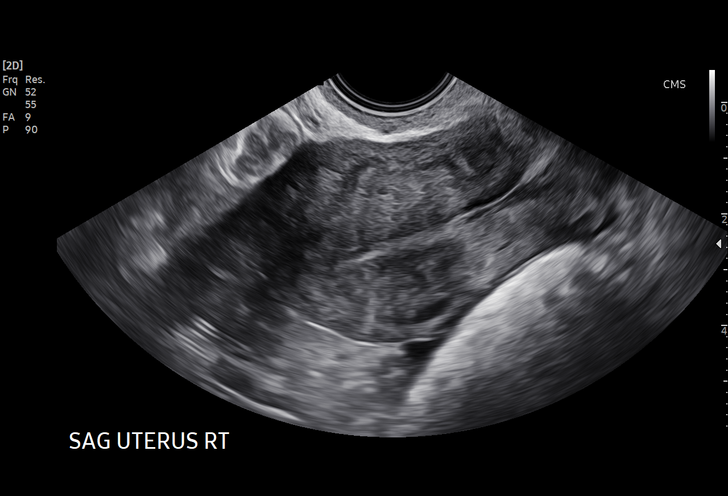
[im 11/46]
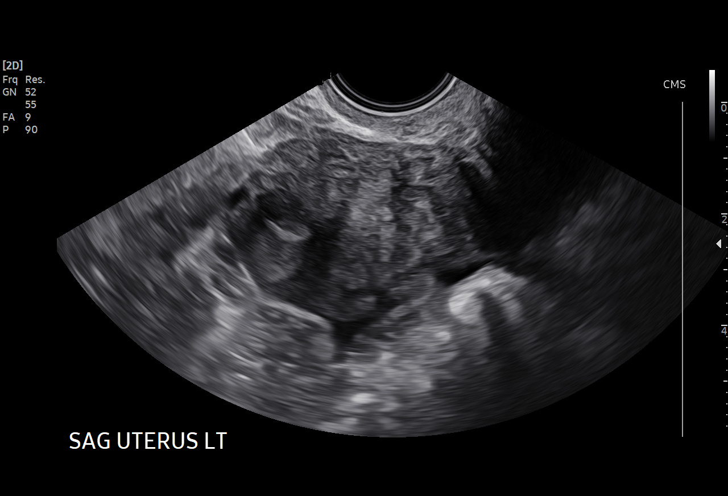
[im 14/46]
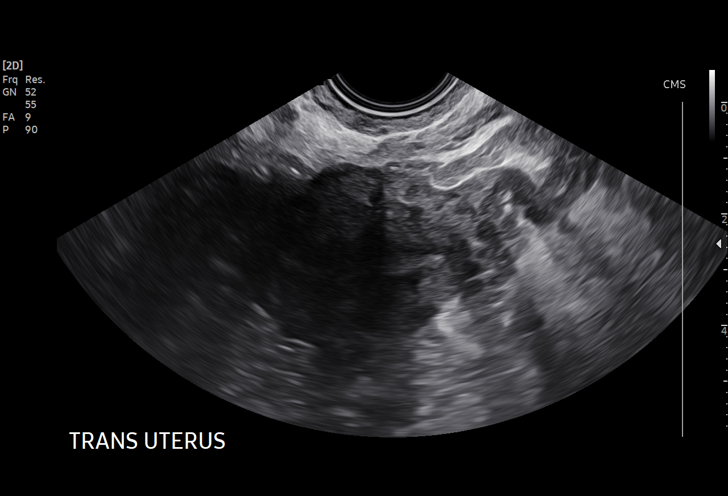
[im 17/46]
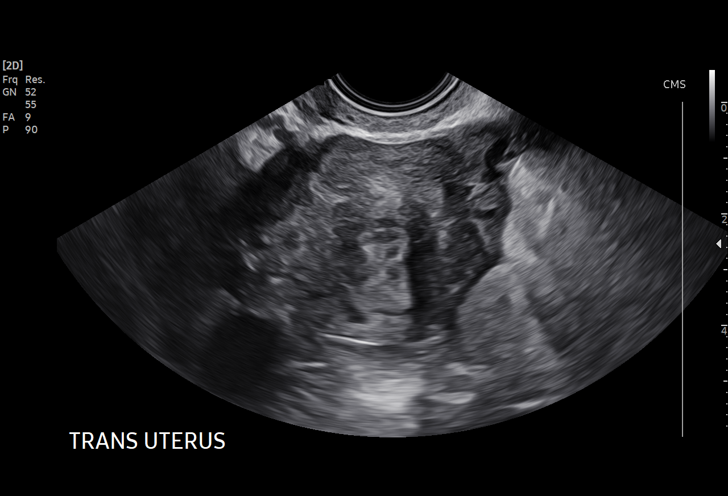
[im 21/46]
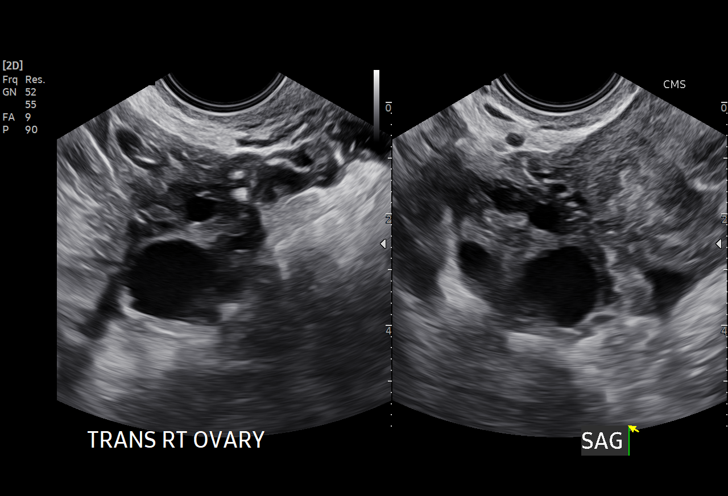
[im 24/46]
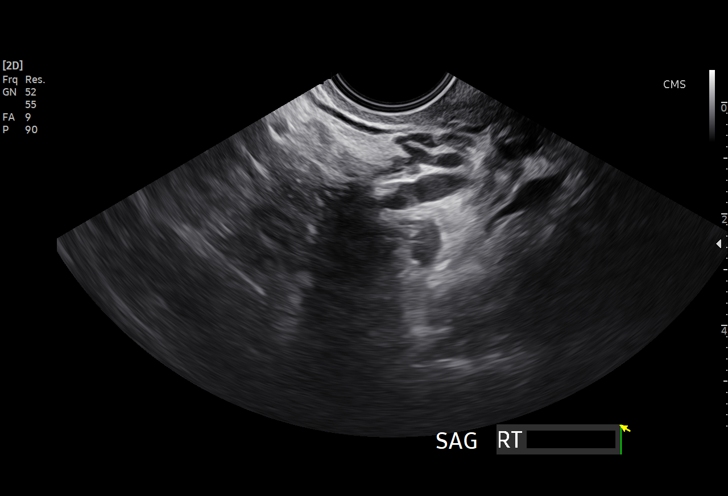
[im 26/46]
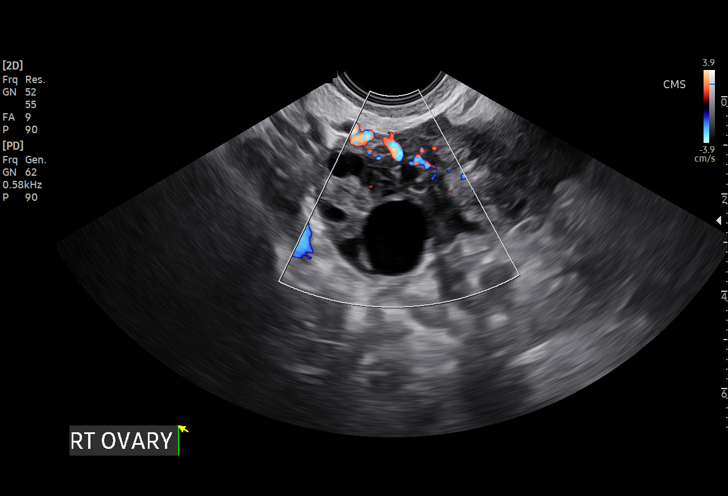
[im 29/46]
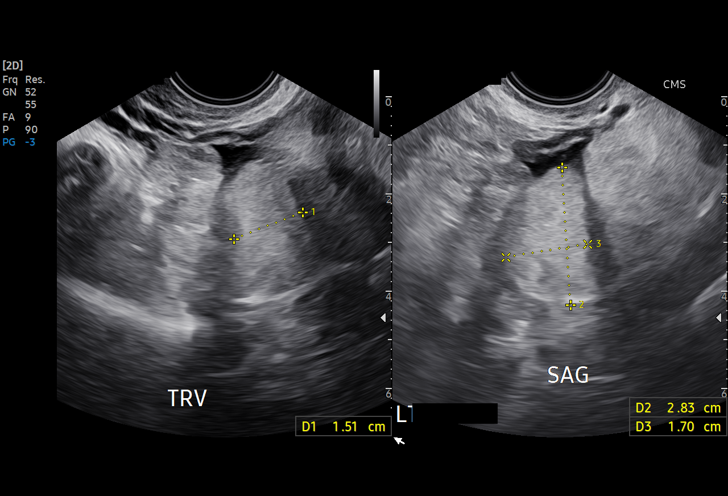
[im 32/46]
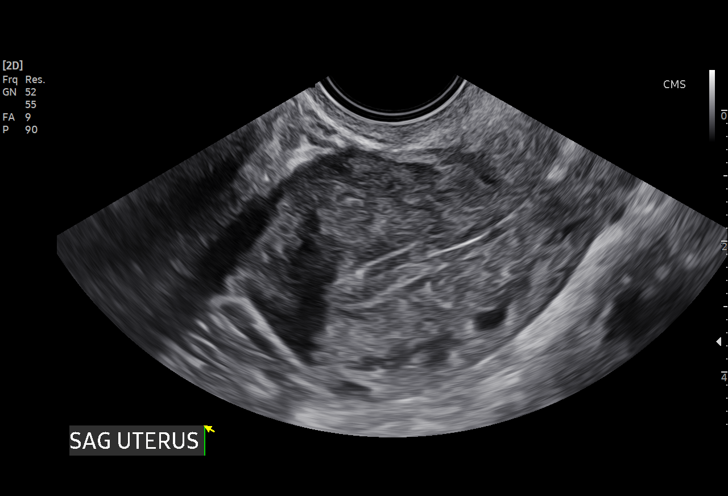
[im 36/46]
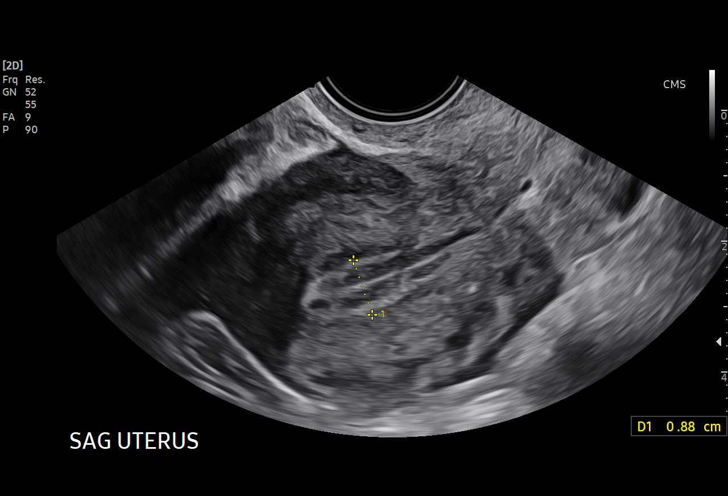
[im 39/46]
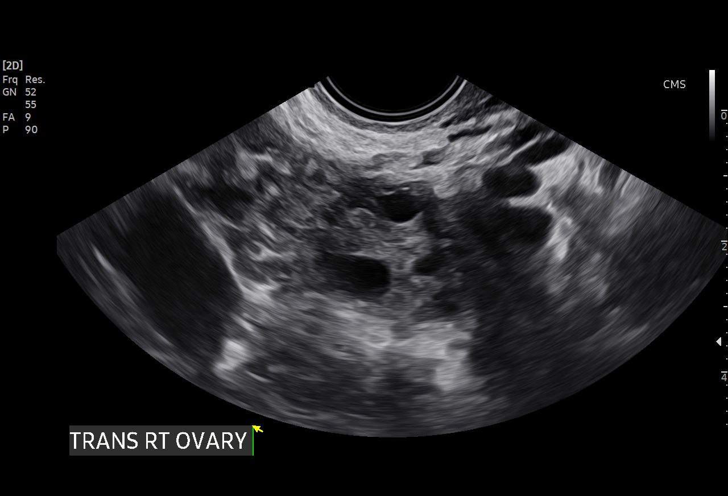
[im 42/46]
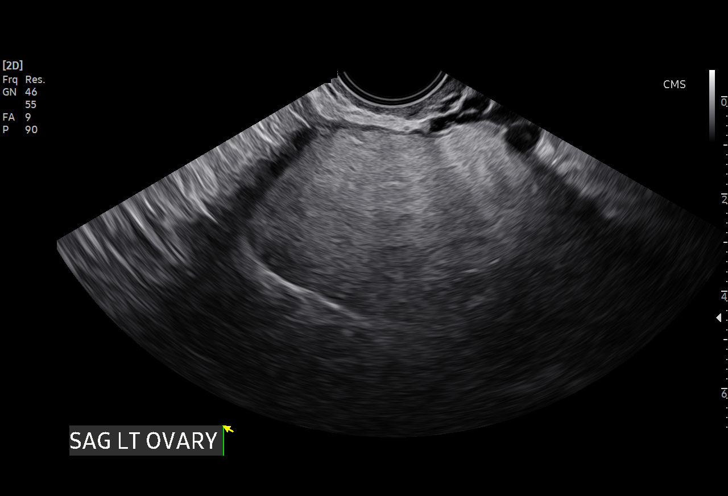
[im 46/46]
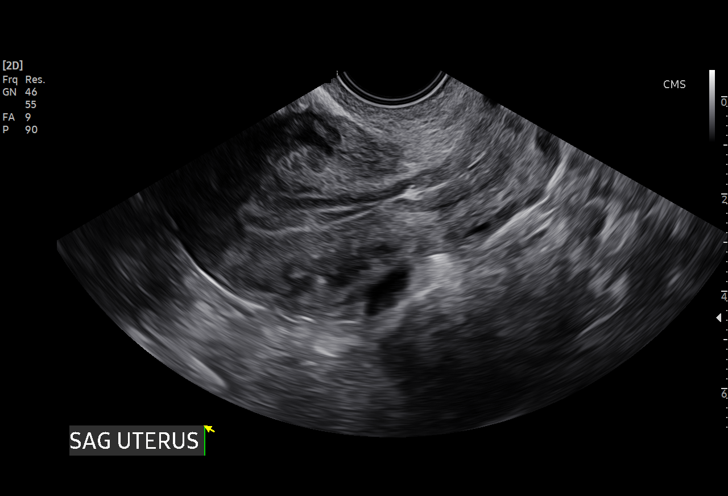

[15 of 28 positions shown; findings below may reference images not displayed]

FINDINGS: Intrauterine gestational sac: None

Yolk sac:  Not Visualized.

Embryo:  Not Visualized.

Cardiac Activity: Not Visualized.

Subchorionic hemorrhage:  None visualized.

Maternal uterus/adnexae: The endometrium measures up to 9 mm in
thickness there is no focal abnormality. The right ovary is normal.
The left ovarian dermoid is again seen measuring 4.1 cm x 4.9 cm x
5.5 cm. There is trace free fluid in the pelvis, nonspecific.
IMPRESSION: No intrauterine gestational sac identified. Given the presence of a
probable gestational sac on the prior study, this is most consistent
with completed miscarriage. Recommend correlation with beta HCG and
continued obstetric follow-up.
